# Patient Record
Sex: Male | Born: 1960 | Race: Black or African American | Hispanic: No | Marital: Single | Smoking: Never smoker
Health system: Southern US, Community
[De-identification: ages and names within clinical notes are randomized; demographics above are authoritative.]

## PROBLEM LIST (undated history)

## (undated) DIAGNOSIS — K449 Diaphragmatic hernia without obstruction or gangrene: Secondary | ICD-10-CM

## (undated) DIAGNOSIS — E785 Hyperlipidemia, unspecified: Secondary | ICD-10-CM

## (undated) DIAGNOSIS — T7840XA Allergy, unspecified, initial encounter: Secondary | ICD-10-CM

## (undated) DIAGNOSIS — K219 Gastro-esophageal reflux disease without esophagitis: Secondary | ICD-10-CM

## (undated) DIAGNOSIS — I493 Ventricular premature depolarization: Secondary | ICD-10-CM

## (undated) DIAGNOSIS — I10 Essential (primary) hypertension: Secondary | ICD-10-CM

## (undated) HISTORY — DX: Diaphragmatic hernia without obstruction or gangrene: K44.9

## (undated) HISTORY — PX: UPPER GASTROINTESTINAL ENDOSCOPY: SHX188

## (undated) HISTORY — DX: Allergy, unspecified, initial encounter: T78.40XA

## (undated) HISTORY — DX: Gastro-esophageal reflux disease without esophagitis: K21.9

## (undated) HISTORY — DX: Hyperlipidemia, unspecified: E78.5

## (undated) HISTORY — PX: COLONOSCOPY: SHX174

## (undated) HISTORY — DX: Ventricular premature depolarization: I49.3

---

## 2006-06-02 ENCOUNTER — Emergency Department (HOSPITAL_COMMUNITY): Admission: EM | Admit: 2006-06-02 | Discharge: 2006-06-02 | Payer: Self-pay | Admitting: Emergency Medicine

## 2009-03-24 ENCOUNTER — Emergency Department (HOSPITAL_COMMUNITY): Admission: EM | Admit: 2009-03-24 | Discharge: 2009-03-24 | Payer: Self-pay | Admitting: Family Medicine

## 2009-05-22 ENCOUNTER — Ambulatory Visit: Payer: Self-pay | Admitting: Family Medicine

## 2009-05-22 DIAGNOSIS — E663 Overweight: Secondary | ICD-10-CM | POA: Insufficient documentation

## 2009-05-22 DIAGNOSIS — M25569 Pain in unspecified knee: Secondary | ICD-10-CM | POA: Insufficient documentation

## 2009-05-22 DIAGNOSIS — L259 Unspecified contact dermatitis, unspecified cause: Secondary | ICD-10-CM

## 2009-05-22 DIAGNOSIS — I10 Essential (primary) hypertension: Secondary | ICD-10-CM

## 2009-05-22 LAB — CONVERTED CEMR LAB
Alkaline Phosphatase: 103 units/L (ref 39–117)
BUN: 10 mg/dL (ref 6–23)
Creatinine, Ser: 0.91 mg/dL (ref 0.40–1.50)
Direct LDL: 144 mg/dL — ABNORMAL HIGH
Glucose, Bld: 84 mg/dL (ref 70–99)
Total Bilirubin: 0.4 mg/dL (ref 0.3–1.2)

## 2009-05-27 ENCOUNTER — Encounter: Payer: Self-pay | Admitting: Family Medicine

## 2009-06-19 ENCOUNTER — Ambulatory Visit: Payer: Self-pay | Admitting: Family Medicine

## 2009-07-17 ENCOUNTER — Ambulatory Visit: Payer: Self-pay | Admitting: Family Medicine

## 2009-08-01 ENCOUNTER — Encounter: Payer: Self-pay | Admitting: Family Medicine

## 2009-09-10 ENCOUNTER — Telehealth (INDEPENDENT_AMBULATORY_CARE_PROVIDER_SITE_OTHER): Payer: Self-pay | Admitting: *Deleted

## 2009-09-16 ENCOUNTER — Ambulatory Visit: Payer: Self-pay | Admitting: Family Medicine

## 2009-09-16 ENCOUNTER — Encounter: Payer: Self-pay | Admitting: *Deleted

## 2009-09-16 ENCOUNTER — Encounter: Payer: Self-pay | Admitting: Family Medicine

## 2009-09-16 LAB — CONVERTED CEMR LAB
Hepatitis B-Post: 0 milliintl units/mL
Rubella: 119.8 intl units/mL — ABNORMAL HIGH
Rubeola IgG: 4.06 — ABNORMAL HIGH

## 2009-09-17 ENCOUNTER — Encounter: Payer: Self-pay | Admitting: Family Medicine

## 2010-09-02 NOTE — Letter (Signed)
Summary: Immunization records [facility] records [facility]   Imported By: De Nurse 10/09/2009 09:39:23  _____________________________________________________________________  External Attachment:    Type:   Image     Comment:   External Document

## 2010-09-02 NOTE — Progress Notes (Signed)
Summary: Lab Work Needed  Phone Note Call from Patient Call back at [phone]   Caller: Patient Summary of Call: Pt needs blood work to see if he has the rubella titer and tetnus shot.  He is starting school and needs this as soon as possible. Initial call taken by: Clydell Hakim,  September 10, 2009 1:39 PM  Follow-up for Phone Call        left message to return call Follow-up by: Gladstone Pih,  September 10, 2009 2:05 PM  Additional Follow-up for Phone Call Additional follow up Details #1::        spoke with pt he needs titer drawn for Rubella,rubeola,Mumps and Varicella and Td immunization.  Explained that if titer low he would need to repeat the immunizations, but wants to do titer verses getting Immunization.   Will you put the orders in or do you want him to sched an appt with you. Additional Follow-up by: Gladstone Pih,  September 11, 2009 2:26 PM  New Problems: PREVENTIVE HEALTH CARE (ICD-V70.0)   Additional Follow-up for Phone Call Additional follow up Details #2::    orders in  Additional Follow-up for Phone Call Additional follow up Details #3:: Details for Additional Follow-up Action Taken: Called pt, left message to return call. Pt returned call explained orders were in the system and that he could call and make a lab apt, to bring form from school with him to ensure correct titers or immunizations are admin per Dr Jennette Kettle Additional Follow-up by: Gladstone Pih,  September 12, 2009 9:09 AM  New Problems: PREVENTIVE HEALTH CARE (ICD-V70.0)

## 2010-09-02 NOTE — Assessment & Plan Note (Signed)
Summary: Tdap given  Nurse Visit   Vital Signs:  Patient profile:   50 year old male Temp:     98.3 degrees F  Vitals Entered By: Theresia Lo RN (September 16, 2009 3:42 PM)  Immunizations Administered:  Tetanus Vaccine:    Vaccine Type: Tdap    Site: left deltoid    Mfr: GlaxoSmithKline    Dose: 0.5 ml    Route: IM    Given by: Theresia Lo RN    Exp. Date: 09/28/2011    Lot #: [id number]    VIS given: 06/21/07 version given September 16, 2009.  Orders Added: 1)  Tdap => 85yrs IM [90715] 2)  Admin 1st Vaccine [90471]     Vital Signs:  Patient profile:   50 year old male Temp:     98.3 degrees F  Vitals Entered By: Theresia Lo RN (September 16, 2009 3:42 PM)

## 2010-09-02 NOTE — Consult Note (Signed)
Summary: [facility]   Imported By: De Nurse 08/13/2009 14:29:37  _____________________________________________________________________  External Attachment:    Type:   Image     Comment:   External Document

## 2010-09-11 ENCOUNTER — Encounter: Payer: Self-pay | Admitting: *Deleted

## 2010-11-08 LAB — POCT RAPID STREP A (OFFICE): Streptococcus, Group A Screen (Direct): POSITIVE — AB

## 2011-07-15 ENCOUNTER — Other Ambulatory Visit: Payer: Self-pay

## 2011-07-15 ENCOUNTER — Emergency Department (HOSPITAL_COMMUNITY)
Admission: EM | Admit: 2011-07-15 | Discharge: 2011-07-15 | Disposition: A | Discharge status: Home / Self Care | Payer: BC Managed Care – PPO | Source: Home / Self Care | Attending: Emergency Medicine | Admitting: Emergency Medicine

## 2011-07-15 DIAGNOSIS — R42 Dizziness and giddiness: Secondary | ICD-10-CM

## 2011-07-15 HISTORY — DX: Essential (primary) hypertension: I10

## 2011-07-15 LAB — POCT I-STAT, CHEM 8
Calcium, Ion: 1.21 mmol/L (ref 1.12–1.32)
Creatinine, Ser: 1 mg/dL (ref 0.50–1.35)
Glucose, Bld: 90 mg/dL (ref 70–99)
HCT: 50 % (ref 39.0–52.0)
Hemoglobin: 17 g/dL (ref 13.0–17.0)

## 2011-07-15 NOTE — ED Provider Notes (Signed)
History     CSN: [id number] Arrival date & time: 07/15/2011  3:43 PM   First MD Initiated Contact with Patient 07/15/11 1559      Chief Complaint  Patient presents with  . Dizziness    (Consider location/radiation/quality/duration/timing/severity/associated sxs/prior treatment) HPI Comments: Brett Shaw had an episode of dizziness today at work that began fairly suddenly at around 12 noon and lasted for an hour. It has gone away completely right now. He felt lightheaded and off balance. He denied any spinning of the surroundings or his head or at sensation of presyncope. He denies any other associated symptoms such as headaches, diplopia, blurred vision, shortness of breath, chest pain, tightness, pressure, palpitations, paresthesias, muscle weakness, or difficulty speaking. He has not had episodes like this in the past. He denies any history of cardiac disease, stroke, or diabetes. In the chart there is a history of hypertension and the fact that he was on quinapril, but he himself denies any history of ever having had high blood pressure or ever having taken quinapril. He is not taking any medication right now. He denies any abdominal pain or tarry stools. He has had no chest pain or shortness of breath. His symptoms were not worsened by movement or posture.   Past Medical History  Diagnosis Date  . Hypertension     History reviewed. No pertinent past surgical history.  Family History  Problem Relation Age of Onset  . Diabetes Other   . Heart failure Other     History  Substance Use Topics  . Smoking status: Never Smoker   . Smokeless tobacco: Not on file  . Alcohol Use: Yes      Review of Systems  HENT: Negative for hearing loss, ear pain, congestion, rhinorrhea, sneezing, postnasal drip and tinnitus.   Eyes: Negative for visual disturbance.  Cardiovascular: Negative for chest pain and palpitations.  Neurological: Positive for dizziness and light-headedness. Negative for  syncope, facial asymmetry, speech difficulty, weakness, numbness and headaches.    Allergies  Shellfish allergy  Home Medications  No current outpatient prescriptions on file.  BP 128/77  Pulse 78  Temp(Src) 97.1 F (36.2 C) (Oral)  Resp 18  SpO2 97%  Physical Exam  Nursing note and vitals reviewed. Constitutional: He is oriented to person, place, and time. He appears well-developed and well-nourished. No distress.  HENT:  Head: Normocephalic and atraumatic.  Right Ear: External ear normal.  Left Ear: External ear normal.  Nose: Nose normal.  Mouth/Throat: Oropharynx is clear and moist.  Eyes: Conjunctivae and EOM are normal. Pupils are equal, round, and reactive to light. Right eye exhibits normal extraocular motion and no nystagmus. Left eye exhibits normal extraocular motion and no nystagmus.  Neck: Normal range of motion. Neck supple.  Cardiovascular: Normal rate, regular rhythm, normal heart sounds and intact distal pulses.  Exam reveals no gallop and no friction rub.   No murmur heard. Neurological: He is alert and oriented to person, place, and time. He has normal strength and normal reflexes. He displays no tremor. No cranial nerve deficit or sensory deficit. He exhibits normal muscle tone. Coordination and gait normal.       On neuro exam he was alert and oriented x3. His speech was clear, fluent, and appropriate. PERRLA, full EOMs, fundi were benign. His gait was normal, he was able to perform tandem gait and Romberg sign was negative. Muscle strength was normal in upper and lower extremities. DTRs were 1+ and symmetrical. Sensation was intact to  light touch and cranial nerves were intact. His Dix-Hallpike maneuver was negative both the right and the left.  Skin: He is not diaphoretic.    ED Course  Procedures (including critical care time)  Results for orders placed during the hospital encounter of 07/15/11  GLUCOSE, CAPILLARY      Component Value Range    Glucose-Capillary 82  70 - 99 (mg/dL)  POCT I-STAT, CHEM 8      Component Value Range   Sodium 140  135 - 145 (mEq/L)   Potassium 3.9  3.5 - 5.1 (mEq/L)   Chloride 106  96 - 112 (mEq/L)   BUN 11  6 - 23 (mg/dL)   Creatinine, Ser 4.09  0.50 - 1.35 (mg/dL)   Glucose, Bld 90  70 - 99 (mg/dL)   Calcium, Ion 8.11  9.14 - 1.32 (mmol/L)   TCO2 27  0 - 100 (mmol/L)   Hemoglobin 17.0  13.0 - 17.0 (g/dL)   HCT 78.2  95.6 - 21.3 (%)      Labs Reviewed  GLUCOSE, CAPILLARY  POCT I-STAT, CHEM 8  I-STAT, CHEM 8   No results found.   Date: 07/15/2011  Rate: 72  Rhythm: normal sinus rhythm  QRS Axis: normal  Intervals: normal  ST/T Wave abnormalities: nonspecific T wave changes  Conduction Disutrbances:none  Narrative Interpretation: Normal sinus rhythm with sinus arrhythmia, nonspecific T wave changes, otherwise normal  Old EKG Reviewed: none available   1. Dizziness       MDM  The patient had a one-hour episode of dizziness today which was not vertigo and not presyncope without any other obvious red flag type symptoms. His may have represented a short-lived episode of benign positional vertigo or acute labyrinthitis which resolved quickly on its own. There is no sign of stroke like symptoms. His EKG was normal except for some nonspecific T wave changes no old EKG for comparison. I told him for right now to take tomorrow off and for any further episodes to return either here or to the emergency room and gave him some red flag symptoms to watch out for in the ensuing days. I think his nonspecific T wave changes need to be followed up by a cardiologist and gave him the name of Dr. Marca Ancona for followup.        Roque Lias, MD 07/15/11 [phone]

## 2011-07-15 NOTE — ED Notes (Signed)
C/o has been nauseated in AM for past few weeks, and this AM while at work, had episode of weakness and dizziness , denies sweating or nausea; dizziness not reproducible through position changes

## 2011-07-16 ENCOUNTER — Telehealth (HOSPITAL_COMMUNITY): Payer: Self-pay | Admitting: Emergency Medicine

## 2011-07-16 NOTE — ED Notes (Signed)
[facility] and was found to have non-specific T wave changes on his EKG.  Will make appointment with Dr. Marca Ancona.  Roque Lias, MD 07/16/11 1016

## 2011-07-16 NOTE — Telephone Encounter (Signed)
Patient will be scheduled with Dr. Shirlee Latch.

## 2011-07-16 NOTE — Telephone Encounter (Signed)
Appointment to be made for patient with Dr. Marca Ancona.

## 2011-07-20 ENCOUNTER — Telehealth (HOSPITAL_COMMUNITY): Payer: Self-pay | Admitting: *Deleted

## 2011-07-20 ENCOUNTER — Telehealth: Payer: Self-pay | Admitting: Cardiology

## 2011-07-20 NOTE — ED Notes (Signed)
I called Dr. Alford Highland office and they said they can see where Dr. Lorenz Coaster did a referral, but it is not where they would see it to know to schedule an appt. I explained that my system is not set up to this yet.  Appt. scheduled for Jan 11 @ 1430, pt. to arrive @ 1415 to do paperwork.  Pt. notified of appt. Pt. told to go to ED if any further episodes prior to his appt. as instructed.  Dr. Lorenz Coaster notified that appt has been made adn patient is aware. Brett Shaw 07/20/2011

## 2011-08-14 ENCOUNTER — Encounter: Payer: Self-pay | Admitting: *Deleted

## 2011-08-14 ENCOUNTER — Ambulatory Visit (INDEPENDENT_AMBULATORY_CARE_PROVIDER_SITE_OTHER): Payer: BC Managed Care – PPO | Admitting: Cardiology

## 2011-08-14 ENCOUNTER — Encounter: Payer: Self-pay | Admitting: Cardiology

## 2011-08-14 VITALS — BP 140/96 | HR 84 | Ht 71.0 in | Wt 296.0 lb

## 2011-08-14 DIAGNOSIS — Z9189 Other specified personal risk factors, not elsewhere classified: Secondary | ICD-10-CM

## 2011-08-14 DIAGNOSIS — R42 Dizziness and giddiness: Secondary | ICD-10-CM

## 2011-08-14 NOTE — Patient Instructions (Signed)
Take aspirin 81mg  daily.  Your physician recommends that you return for a FASTING lipid profile: 780.4  Your physician has recommended that you wear a holter monitor. Holter monitors are medical devices that record the heart's electrical activity. Doctors most often use these monitors to diagnose arrhythmias. Arrhythmias are problems with the speed or rhythm of the heartbeat. The monitor is a small, portable device. You can wear one while you do your normal daily activities. This is usually used to diagnose what is causing palpitations/syncope (passing out). 24 hour  You do not need to schedule a follow-up appointment with Dr Shirlee Latch.

## 2011-08-16 DIAGNOSIS — R42 Dizziness and giddiness: Secondary | ICD-10-CM | POA: Insufficient documentation

## 2011-08-16 DIAGNOSIS — Z9189 Other specified personal risk factors, not elsewhere classified: Secondary | ICD-10-CM | POA: Insufficient documentation

## 2011-08-16 NOTE — Assessment & Plan Note (Signed)
Patient's cardiac risk factors include premature CAD and HTN.  BP is mildly elevated today.  I am going to check his lipids and have him start ASA 81 mg daily.  He may need antihypertensive treatment.  He certainly needs dieting/exercise for weight loss.

## 2011-08-16 NOTE — Progress Notes (Signed)
PCP: Dr. Jennette Kettle  51 yo with history of HTN presents for cardiology evaluation after visit to ER in 12/12 for lightheadedness.  Patient had been at work and standing for about 4 hours (normal for him at work).  He became lightheaded.  This persisted for several hours, so he went to the ER.  No syncope, no palpitations, no chest pain.  No dyspnea.  ECG and orthostatics in the ER were unremarkable.  He was discharged to followup with cardiology.  He has had no recurrence of these symptoms.  He has a history of HTN but is on no medications.  BP is mildly elevated today.  His brother had an MI at age 5.    ECG: NSR, nonspecific lateral T wave inversions.    Labs (12/12): creatinine 1.0, HCT 50  PMH: 1. HTN 2. GERD 3. Obesity  FH: Brother with MI at 56, father with MI at 36  SH: Nonsmoker, moved to [place] not long ago from [place].  Works in [employer].  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   ROS: All systems reviewed and negative except as per HPI.   Current Outpatient Prescriptions  Medication Sig Dispense Refill  . cholecalciferol (VITAMIN D) 1000 UNITS tablet Take 1,000 Units by mouth daily.      . Cyanocobalamin (B-12) 1000 MCG CAPS Take by mouth daily.      . Multiple Vitamin (MULITIVITAMIN WITH MINERALS) TABS Take 1 tablet by mouth daily.      Marland Kitchen aspirin EC 81 MG tablet Take 1 tablet (81 mg total) by mouth daily.        BP 140/96  Pulse 84  Ht 5\' 11"  (1.803 m)  Wt 134.265 kg (296 lb)  BMI 41.28 kg/m2 General: NAD, obese Neck: No JVD, no thyromegaly or thyroid nodule.  Lungs: Clear to auscultation bilaterally with normal respiratory effort. CV: Nondisplaced PMI.  Heart regular S1/S2, no S3/S4, no murmur.  No peripheral edema.  No carotid bruit.  Normal pedal pulses.  Abdomen: Soft, nontender, no hepatosplenomegaly, no distention.  Skin: Intact without lesions or rashes.  Neurologic: Alert and oriented x 3.  Psych: Normal affect. Extremities: No  clubbing or cyanosis.  HEENT: Normal.

## 2011-08-16 NOTE — Assessment & Plan Note (Signed)
Patient had an episode of prolonged dizziness after standing for about 4 hours at work.  This is not an unusual amount of standing for him.  He was not orthostatic.  He has not had any further symptoms.  It is possible that he was dehydrated.  I will have him wear a 48 hour holter monitor.

## 2011-08-21 ENCOUNTER — Other Ambulatory Visit: Payer: BC Managed Care – PPO | Admitting: *Deleted

## 2011-08-21 ENCOUNTER — Encounter (INDEPENDENT_AMBULATORY_CARE_PROVIDER_SITE_OTHER): Payer: BC Managed Care – PPO

## 2011-08-21 DIAGNOSIS — R42 Dizziness and giddiness: Secondary | ICD-10-CM

## 2011-08-24 ENCOUNTER — Telehealth: Payer: Self-pay | Admitting: Cardiology

## 2011-08-24 ENCOUNTER — Other Ambulatory Visit (INDEPENDENT_AMBULATORY_CARE_PROVIDER_SITE_OTHER): Payer: BC Managed Care – PPO | Admitting: *Deleted

## 2011-08-24 DIAGNOSIS — R42 Dizziness and giddiness: Secondary | ICD-10-CM

## 2011-08-24 LAB — LIPID PANEL: VLDL: 29.2 mg/dL (ref 0.0–40.0)

## 2011-08-24 LAB — LDL CHOLESTEROL, DIRECT: Direct LDL: 147.5 mg/dL

## 2011-08-24 NOTE — Telephone Encounter (Signed)
Walk in pt Form " Pt Would Like a Call Please " sent to Message Nurse  08/24/11/KM

## 2011-08-25 ENCOUNTER — Telehealth: Payer: Self-pay | Admitting: *Deleted

## 2011-08-25 DIAGNOSIS — R42 Dizziness and giddiness: Secondary | ICD-10-CM

## 2011-08-25 MED ORDER — PRAVASTATIN SODIUM 40 MG PO TABS: 40.0000 mg | ORAL_TABLET | Freq: Every evening | ORAL | Status: DC

## 2011-08-25 NOTE — Telephone Encounter (Signed)
Notes Recorded by Jacqlyn Krauss, RN on 08/25/2011 at 5:14 PM Discussed with pt. Pt agreed to begin pravastatin 40mg  daily in the evening. He will return for fasting lipid/liver profile 10/20/11. ------  Notes Recorded by Jacqlyn Krauss, RN on 08/25/2011 at 2:10 PM Pt not at home. ------  Notes Recorded by Marca Ancona, MD on 08/25/2011 at 10:38 AM Brother had MI at 40. I would like to see LDL lower. Would suggest starting pravstatin 40 mg daily with lipids/LFTs in 2 months.

## 2011-08-27 ENCOUNTER — Encounter: Payer: Self-pay | Admitting: Family Medicine

## 2011-08-27 ENCOUNTER — Ambulatory Visit (INDEPENDENT_AMBULATORY_CARE_PROVIDER_SITE_OTHER): Payer: BC Managed Care – PPO | Admitting: Family Medicine

## 2011-08-27 VITALS — BP 134/84 | HR 90 | Temp 98.1°F | Ht 71.0 in | Wt 294.2 lb

## 2011-08-27 DIAGNOSIS — Z9189 Other specified personal risk factors, not elsewhere classified: Secondary | ICD-10-CM

## 2011-08-27 DIAGNOSIS — K219 Gastro-esophageal reflux disease without esophagitis: Secondary | ICD-10-CM

## 2011-08-27 DIAGNOSIS — E785 Hyperlipidemia, unspecified: Secondary | ICD-10-CM

## 2011-08-27 DIAGNOSIS — E663 Overweight: Secondary | ICD-10-CM

## 2011-08-27 MED ORDER — ESOMEPRAZOLE MAGNESIUM 40 MG PO CPDR: DELAYED_RELEASE_CAPSULE | ORAL | Status: DC

## 2011-08-27 NOTE — Progress Notes (Signed)
  Subjective:    Patient ID: Brett Shaw, male    DOB: [birth year], 51 y.o.   MRN: [id number]  HPI  GERD: The last 6 weeks he stated significant increase in his reflux symptoms. Has previously had reflux several years ago but was not bothered by again  until recently. He has gained quite a bit of weight. He is working 12 hour shifts with a lot of overtime, not resting much. Eating a poor diet by his report.  Dizziness: Was seen recently in urgent care for dizziness. Had followup at cardiology where he evidently wore a Holter monitor for 2 days. He has not yet gotten those results back. His cardiologist started him on pravastatin which is not yet picked up at the drug store. He has not had any recurrence of the dizziness. He denies chest pains with exertion. He denies any change in his exercise tolerance. He noted no lower extremity swelling.  Review of Systems    see hpi  Objective:   Physical Exam  Vital signs reviewed. GENERAL: Well-developed, well-nourished, no acute distress. OverweightCARDIOVASCULAR: Regular rate and rhythm no murmur gallop or rub LUNGS: Clear to auscultation bilaterally, no rales or wheeze. ABDOMEN: Soft positive bowel sounds NEURO: No gross focal neurological deficits. MSK: Movement of extremity x 4.       Assessment & Plan:  1. GERD would like to start him on high dose therapy with Nexium twice a day. Unclear if his insurance will cover that. If not we will start with 40 mg daily GERD has a followup appointment with me in 3 weeks. #2. Previously diagnosed with hypertension and has had some elevated blood pressures. Right now he is not on any antihypertensive medicines. He is open to making some lifestyle modifications. #3. Hyperlipidemia. Positive family history as a risk factor for cardiovascular disease. Dr. Jearld Pies has start him on pravastatin. I urged him to pick that up and start medication.

## 2011-08-31 ENCOUNTER — Telehealth: Payer: Self-pay | Admitting: *Deleted

## 2011-08-31 NOTE — Telephone Encounter (Signed)
Dr Shirlee Latch reviewed monitor done 08/21/11--frequent PACs and PVCs. If palpitations can try B blocker. Otherwise no treatment necessary. I discussed with pt. Pt states he is not having any palpitations and did not want medication at this time.

## 2011-09-16 ENCOUNTER — Encounter: Payer: Self-pay | Admitting: Family Medicine

## 2011-09-16 ENCOUNTER — Ambulatory Visit (INDEPENDENT_AMBULATORY_CARE_PROVIDER_SITE_OTHER): Payer: BC Managed Care – PPO | Admitting: Family Medicine

## 2011-09-16 VITALS — BP 112/84 | HR 72 | Temp 97.7°F | Ht 71.0 in | Wt 287.0 lb

## 2011-09-16 DIAGNOSIS — Z9189 Other specified personal risk factors, not elsewhere classified: Secondary | ICD-10-CM

## 2011-09-16 DIAGNOSIS — I10 Essential (primary) hypertension: Secondary | ICD-10-CM

## 2011-09-16 DIAGNOSIS — E785 Hyperlipidemia, unspecified: Secondary | ICD-10-CM

## 2011-09-16 DIAGNOSIS — J309 Allergic rhinitis, unspecified: Secondary | ICD-10-CM | POA: Insufficient documentation

## 2011-09-16 DIAGNOSIS — Z0000 Encounter for general adult medical examination without abnormal findings: Secondary | ICD-10-CM | POA: Insufficient documentation

## 2011-09-16 DIAGNOSIS — K219 Gastro-esophageal reflux disease without esophagitis: Secondary | ICD-10-CM

## 2011-09-16 LAB — LIPID PANEL
Cholesterol: 208 mg/dL — ABNORMAL HIGH (ref 0–200)
Total CHOL/HDL Ratio: 5.3 Ratio
Triglycerides: 152 mg/dL — ABNORMAL HIGH (ref <150)
VLDL: 30 mg/dL (ref 0–40)

## 2011-09-16 LAB — COMPREHENSIVE METABOLIC PANEL
AST: 20 U/L (ref 0–37)
Albumin: 4.1 g/dL (ref 3.5–5.2)
BUN: 10 mg/dL (ref 6–23)
Calcium: 9.2 mg/dL (ref 8.4–10.5)
Chloride: 105 mEq/L (ref 96–112)
Creat: 1 mg/dL (ref 0.50–1.35)
Glucose, Bld: 83 mg/dL (ref 70–99)
Potassium: 4.3 mEq/L (ref 3.5–5.3)

## 2011-09-16 MED ORDER — FLUTICASONE PROPIONATE 50 MCG/ACT NA SUSP: 2.0000 | Freq: Every day | NASAL | Status: DC

## 2011-09-16 NOTE — Assessment & Plan Note (Signed)
#  2. Elevated blood pressure with previous diagnosis of hypertension. His systolic is actually much better today and he has made significant Lifestyle modifications. He will continue with diet and exercise plan and I'll see him back in 3-6 months for followup.

## 2011-09-16 NOTE — Assessment & Plan Note (Signed)
2. History of hyperlipidemia. Fasting lipid profile today. We did discuss and if he has to start a medication he would like to start the "safest" one. His cardiologist had given him a prescription for Pravachol but he has not yet filled that.

## 2011-09-16 NOTE — Assessment & Plan Note (Signed)
GERD significantly improved on twice a day dosing of Nexium. We will decrease to daily and follow. If breakthru, will return to BID for 3 m.

## 2011-09-16 NOTE — Progress Notes (Signed)
  Subjective:    Patient ID: Brett Shaw, male    DOB: Jun 10, 1961, 51 y.o.   MRN: [id number]  HPI #1. Here for preventive care and followup of GERD. Significant improvement on a twice a day Nexium. He has actually skipped a few days here and there and not had breakthrough. His insurance covered the twice a day dosing but it was with a very high co-pay. He would like to either decrease or discontinue it. #2. Obesity and hypertension but she is trying to manage currently with lifestyle modifications. He has made significant change in his diet and has lost 9 pounds. He would like to continue lifestyle modification for blood pressure treatment at this time. #3. Previously diagnosed with hyperlipidemia. His cardiologist had given him a prescription for Pravachol however he has not yet filled that. He will see a fasting lipid profile today and then discuss further. #4. Palpitations. His cardiologist did a Holter monitor and it showed PVCs and PACs. He has chosen not to pursue any type of beta blocker for symptom relief as he says the symptoms are not that bad. #5. Chronic nasal stuffiness. Says he has had his nasal sinuses looked at multiple times with scopes et Karie Soda. Has previously had broken nose. They told him he might benefit from having nasal or sinus surgery but he has not pursued that. He mostly has a lot of nasal stuffiness and when he gets a cold it is  truly miserable.   Review of Systems    improved reflux. Denies chest pain, denies swelling of extremities. Denies headache, fever, sweats, chills. Objective:   Physical Exam  Vital signs reviewed GENERALl: Well developed, well nourished, in no acute distress.  THYROID: normal without nodularity CAROTID ARTERIES: without bruits LUNGS: clear to auscultation bilaterally. No wheezes or rales. HEART: Regular rate and rhythm, no murmurs ABDOMEN: soft with positive bowel sounds NEURO: No gross focal deficits SKIN: Thorax , upper extremity  and head exam of skin reveals no worrisome lesions. Areas of previous atopic change on the elbow is still have some thickened hyperpigmented skin but significantly improved from the first time I saw him. HEENT: Nasal mucosa boggy extremely swollen turbinates. It looks like he has a septal deviation (from her prior broken nose) oropharynx reveals some cobblestoning in the posterior pharynx but no erythema and no exudate. The neck is without lymphadenopathy. Full range of motion. No lymphadenopathy        Assessment & Plan:  #1. Well adult checkup. #2. Elevated blood pressure with previous diagnosis of hypertension. His systolic is actually much better today and he has made significant Lifestyle modifications. He will continue with diet and exercise plan and I'll see him back in 3-6 months for followup. #3. History of hyperlipidemia. Fasting lipid profile today. We did discuss and if he has to start a medication he would like to start the "safest" one. His cardiologist had given him a prescription for Pravachol but he has not yet filled that. #4. GERD significantly improved on twice a day dosing of Nexium. We will decrease to daily and follow. If breakthru, will return to BID for 3 m. #5. Preventive needs: We discussed diet and exercise. He has declined flu shot. Tdap 09/2009. No family history significant that would require early screening for colonoscopy or prostate cancer. #6. Sinus disease. There is some complaint of allergic rhinitis as he is extremely boggy mucosa. We will try improving his symptoms by using daily fluticasone.

## 2011-09-16 NOTE — Patient Instructions (Signed)
I have called in the nasal spray. We'll see how that does for you. I will send you noted that your labwork. Great to see you. Congratulations on the lifestyle modifications unit made, and they really seem to be working!

## 2011-09-22 ENCOUNTER — Encounter: Payer: Self-pay | Admitting: Family Medicine

## 2011-10-01 ENCOUNTER — Other Ambulatory Visit: Payer: Self-pay | Admitting: *Deleted

## 2011-10-01 MED ORDER — OMEPRAZOLE 20 MG PO TBEC: 40.0000 mg | DELAYED_RELEASE_TABLET | Freq: Two times a day (BID) | ORAL | Status: DC

## 2011-10-20 ENCOUNTER — Other Ambulatory Visit: Payer: BC Managed Care – PPO

## 2011-10-27 ENCOUNTER — Encounter: Payer: Self-pay | Admitting: *Deleted

## 2011-11-15 ENCOUNTER — Emergency Department (HOSPITAL_COMMUNITY): Payer: BC Managed Care – PPO

## 2011-11-15 ENCOUNTER — Other Ambulatory Visit: Payer: Self-pay

## 2011-11-15 ENCOUNTER — Emergency Department (HOSPITAL_COMMUNITY)
Admission: EM | Admit: 2011-11-15 | Discharge: 2011-11-15 | Disposition: A | Discharge status: Home / Self Care | Payer: BC Managed Care – PPO | Attending: Emergency Medicine | Admitting: Emergency Medicine

## 2011-11-15 ENCOUNTER — Encounter (HOSPITAL_COMMUNITY): Payer: Self-pay | Admitting: *Deleted

## 2011-11-15 DIAGNOSIS — R0609 Other forms of dyspnea: Secondary | ICD-10-CM | POA: Insufficient documentation

## 2011-11-15 DIAGNOSIS — E785 Hyperlipidemia, unspecified: Secondary | ICD-10-CM | POA: Insufficient documentation

## 2011-11-15 DIAGNOSIS — R0989 Other specified symptoms and signs involving the circulatory and respiratory systems: Secondary | ICD-10-CM | POA: Insufficient documentation

## 2011-11-15 DIAGNOSIS — K219 Gastro-esophageal reflux disease without esophagitis: Secondary | ICD-10-CM | POA: Insufficient documentation

## 2011-11-15 DIAGNOSIS — R0600 Dyspnea, unspecified: Secondary | ICD-10-CM

## 2011-11-15 DIAGNOSIS — I10 Essential (primary) hypertension: Secondary | ICD-10-CM | POA: Insufficient documentation

## 2011-11-15 DIAGNOSIS — R0602 Shortness of breath: Secondary | ICD-10-CM | POA: Insufficient documentation

## 2011-11-15 DIAGNOSIS — R079 Chest pain, unspecified: Secondary | ICD-10-CM | POA: Insufficient documentation

## 2011-11-15 DIAGNOSIS — Z79899 Other long term (current) drug therapy: Secondary | ICD-10-CM | POA: Insufficient documentation

## 2011-11-15 DIAGNOSIS — R0789 Other chest pain: Secondary | ICD-10-CM | POA: Insufficient documentation

## 2011-11-15 DIAGNOSIS — R42 Dizziness and giddiness: Secondary | ICD-10-CM | POA: Insufficient documentation

## 2011-11-15 NOTE — ED Notes (Signed)
Pt states his breathing is better since his arrival. Pt appears more relaxed.

## 2011-11-15 NOTE — ED Provider Notes (Signed)
History     CSN: [id number]  Arrival date & time 11/15/11  2023   First MD Initiated Contact with Patient 11/15/11 2215      Chief Complaint  Patient presents with  . Chest Pain  . Shortness of Breath  . Dizziness    (Consider location/radiation/quality/duration/timing/severity/associated sxs/prior treatment) HPI Comments: Brett Shaw is a 51 y.o. male who presents with sensation of shortness of breath. For 2 days he feels like he has to think to breath. When he breathes he has to take deep breaths. He is also had some chest tightness, which feels like his reflux disease. He is continuing to take Nexium twice a day. He was evaluated by a cardiologist several months ago, and had a Holter monitor. The monitor showed frequent PVCs. The patient feels his PVCs, but they do not make him have pain. He does not have near syncope, or syncope. He has had no cough, fever, chills, nausea, vomiting. Ambulates easily, and can exercise by walking 3 times a week without problems. Has not missed any work and is able to perform his job.  The history is provided by the patient.    Past Medical History  Diagnosis Date  . Hypertension   . GERD (gastroesophageal reflux disease)   . Hyperlipidemia     History reviewed. No pertinent past surgical history.  Family History  Problem Relation Age of Onset  . Diabetes Other   . Heart failure Other   . Heart attack Brother 2    History  Substance Use Topics  . Smoking status: Never Smoker   . Smokeless tobacco: Not on file  . Alcohol Use: Yes      Review of Systems  All other systems reviewed and are negative.    Allergies  Shellfish allergy  Home Medications   Current Outpatient Rx  Name Route Sig Dispense Refill  . ASPIRIN EC 81 MG PO TBEC Oral Take 1 tablet (81 mg total) by mouth daily.    Marland Kitchen VITAMIN D 1000 UNITS PO TABS Oral Take 1,000 Units by mouth daily.    . B-12 1000 MCG PO CAPS Oral Take by mouth daily.    Marland Kitchen ESOMEPRAZOLE  MAGNESIUM 40 MG PO CPDR  Take one capsule by mouth twice a day 60 capsule 1  . ADULT MULTIVITAMIN W/MINERALS CH Oral Take 1 tablet by mouth daily.    [name] 1 MG DT MISC      . FLUTICASONE PROPIONATE 50 MCG/ACT NA SUSP        BP 136/80  Pulse 82  Temp(Src) 98.3 F (36.8 C) (Oral)  Resp 20  Ht 5\' 11"  (1.803 m)  Wt 285 lb (129.275 kg)  BMI 39.75 kg/m2  SpO2 100%  Physical Exam  Nursing note and vitals reviewed. Constitutional: He is oriented to person, place, and time. He appears well-developed and well-nourished.  HENT:  Head: Normocephalic and atraumatic.  Right Ear: External ear normal.  Left Ear: External ear normal.  Eyes: Conjunctivae and EOM are normal. Pupils are equal, round, and reactive to light.  Neck: Normal range of motion and phonation normal. Neck supple.  Cardiovascular: Normal rate, regular rhythm, normal heart sounds and intact distal pulses.   Pulmonary/Chest: Effort normal and breath sounds normal. He exhibits no bony tenderness.  Abdominal: Soft. Normal appearance. There is no tenderness.  Musculoskeletal: Normal range of motion.  Neurological: He is alert and oriented to person, place, and time. He has normal strength. No cranial nerve deficit or  sensory deficit. He exhibits normal muscle tone. Coordination normal.  Skin: Skin is warm, dry and intact.  Psychiatric: He has a normal mood and affect. His behavior is normal. Judgment and thought content normal.    ED Course  Procedures (including critical care time)   Date: 11/15/2011  Rate: 69  Rhythm: normal sinus rhythm  QRS Axis: normal  Intervals: normal  ST/T Wave abnormalities: normal  Conduction Disutrbances:none  Narrative Interpretation:   Old EKG Reviewed: changes noted-minor T-wave abnormality, resolved    Labs Reviewed - No data to display No results found. Reeval., 23:19- he states it that he is belching some and has been diagnosed with a hiatal hernia in the past. The.  1.  Dyspnea       MDM  Nonspecific dizziness, doubt ACS, PE, pneumonia, bronchitis, or metabolic instability.   Plan: Home Medications- trial of Maalox for belching; Home Treatments- rest; Recommended follow up- PCP in 1-2 weeks        Flint Melter, MD 11/19/11 [phone]

## 2011-11-15 NOTE — ED Notes (Addendum)
Pt c/o SOB and labored breathing since yesterday afternoon. Pt states he "has to think about breathing". Pt also c/o of chest tightness that occurs when he becomes SOB. Pt states he has been seen multiple times for this problem since December but symptoms continue. Pt states symptoms are at their worst when he is at work or doing strenuous activity.

## 2011-11-15 NOTE — ED Notes (Signed)
Pt reports working in the yard yesterday afternoon and had labored breathing, central chest pain and took nexium. Pt reports shortness of breath and chest pain has continued.  Pt was seen by Cardiologist in January.  Pt reports he has had issues with chest pain intermittently since December 2012.  Pt attributes his symptoms to reflux.

## 2011-11-15 NOTE — Discharge Instructions (Signed)
See your doctor as scheduled, sooner if needed for problems.   Shortness of Breath Shortness of breath (dyspnea) is the feeling of uneasy breathing. Shortness of breath does not always mean that there is a life-threatening illness. However, shortness of breath requires immediate medical care. CAUSES  Causes for shortness of breath include:  Not enough oxygen in the air (as with high altitudes or with a smoke-filled room).   Short-term (acute) lung disease, including:   Infections such as pneumonia.   Fluid in the lungs, such as heart failure.   A blood clot in the lungs (pulmonary embolism).   Lasting (chronic) lung diseases.   Heart disease (heart attack, angina, heart failure, and others).   Low red blood cells (anemia).   Poor physical fitness. This can cause shortness of breath when you exercise.   Chest or back injuries or stiffness.   Being overweight (obese).   Anxiety. This can make you feel like you are not getting enough air.  DIAGNOSIS  Serious medical problems can usually be found during your physical exam. Many tests may also be done to determine why you are having shortness of breath. Tests include:  Chest X-rays.   Lung function tests.   Blood tests.   Electrocardiography.   Exercise testing.   A cardiac echo.   Imaging scans.  Your caregiver may not be able to find a cause for your shortness of breath after your exam. In this case, it is important to have a follow-up exam with your caregiver as directed.  HOME CARE INSTRUCTIONS   Do not smoke. Smoking is a common cause of shortness of breath. Ask for help to stop smoking.   Avoid being around chemicals that may bother your breathing (paint fumes, dust).   Rest as needed. Slowly resume your usual activities.   If medicines were prescribed, take them as directed for the full length of time directed. This includes oxygen and any inhaled medicines.   Follow up with your caregiver as directed.  Waiting to do so or failure to follow up could result in worsening of your condition and possible disability or death.   Be sure you understand what to do or who to call if your shortness of breath worsens.  SEEK MEDICAL CARE IF:   Your condition does not improve in the time expected.   You have a hard time doing your normal activities even with rest.   You have any side effects or problems with the medicines prescribed.   You develop any new symptoms.  SEEK IMMEDIATE MEDICAL CARE IF:   Your shortness of breath is getting worse.   You feel lightheaded, faint, or develop a cough not controlled with medicines.   You start coughing up blood.   You have pain with breathing.   You have chest pain or pain in your arms, shoulders, or abdomen.   You have a fever.   You are unable to walk up stairs or exercise the way you normally do.   Your symptoms are getting worse.  Document Released: 04/14/2001 Document Revised: 07/09/2011 Document Reviewed: 11/30/2007 [facility] Patient Information 2012 [place], [place].

## 2011-11-18 ENCOUNTER — Encounter: Payer: Self-pay | Admitting: Family Medicine

## 2011-11-18 ENCOUNTER — Ambulatory Visit (INDEPENDENT_AMBULATORY_CARE_PROVIDER_SITE_OTHER): Payer: BC Managed Care – PPO | Admitting: Family Medicine

## 2011-11-18 VITALS — BP 122/90 | Temp 98.2°F | Ht 71.0 in | Wt 275.2 lb

## 2011-11-18 DIAGNOSIS — I10 Essential (primary) hypertension: Secondary | ICD-10-CM

## 2011-11-18 DIAGNOSIS — E785 Hyperlipidemia, unspecified: Secondary | ICD-10-CM

## 2011-11-18 DIAGNOSIS — K219 Gastro-esophageal reflux disease without esophagitis: Secondary | ICD-10-CM

## 2011-11-18 LAB — LIPID PANEL
Cholesterol: 206 mg/dL — ABNORMAL HIGH (ref 0–200)
HDL: 38 mg/dL — ABNORMAL LOW (ref >39)
Total CHOL/HDL Ratio: 5.4 Ratio
Triglycerides: 128 mg/dL (ref <150)

## 2011-11-18 NOTE — Patient Instructions (Signed)
Please make an appointment with Dr Jearld Pies. We will send your colesterol results to him. We will also call you with your appointment for

## 2011-11-19 ENCOUNTER — Telehealth: Payer: Self-pay | Admitting: Family Medicine

## 2011-11-19 ENCOUNTER — Encounter: Payer: Self-pay | Admitting: Family Medicine

## 2011-11-19 DIAGNOSIS — K219 Gastro-esophageal reflux disease without esophagitis: Secondary | ICD-10-CM

## 2011-11-19 MED ORDER — OMEPRAZOLE 40 MG PO CPDR: 40.0000 mg | DELAYED_RELEASE_CAPSULE | Freq: Two times a day (BID) | ORAL | Status: DC

## 2011-11-19 NOTE — Telephone Encounter (Signed)
Brett Shaw calling to inquire about the cheaper version of Nexium that was going to be sent to his  pharmacy, [facility] on [address]  Called this morning to see if ready, was told nothing was ever sent in.  Please fill rx and let him him when it's ready.

## 2011-11-19 NOTE — Assessment & Plan Note (Signed)
He is currently working a lot some modifications for decrease in elevated blood pressure. Previous diagnosis of hypertension he has not been on medication now for quite some time. He successfully losing weight. I will see him back in one to 2 months. Hopefully we can continue on a statin keep him off blood pressure medicine. He has had some increased shortness of breath however so I want him to go back and see his cardiologist. He may be beneficial long-term for him just go ahead and start a low-dose ACE inhibitor or other type of medication for renal protective effect as well. Only that to the discretion of the cardiologist.

## 2011-11-19 NOTE — Telephone Encounter (Addendum)
rx is ready for p/u. Called pt and line was busy.Loralee Pacas [place]

## 2011-11-19 NOTE — Assessment & Plan Note (Signed)
Has not been taking his Pravachol. Would like to recheck his cholesterol level as he has made significant lifestyle modifications.

## 2011-11-19 NOTE — Progress Notes (Signed)
  Subjective:    Patient ID: Brett Shaw, male    DOB: 1961-03-17, 51 y.o.   MRN: [id number]  HPI Followup ER visit for increasing shortness of breath. He asked he sounds a little but more consistent with anxiety as he was having sensation of having to remind himself to breathe. He does admit to some increased shortness of breath with going up and down steps but has had no shortness of breath on the job and he has a fairly Administrator, sports job. No chest pains but has had some chest tightness.  #2. He has not taken the Pravachol regularly. In fact he only took for about a week, felt like he was having increased muscle pains and then stopped it. He would like his cholesterol numbers rechecked because he's made significant lifestyle modifications.  #3. Has been using Nexium for reflux. Is having difficulty affording that wonders if he could switch to some other type of medicine. He is also wondering if he needs a repeat EGD as his symptoms are not well controlled currently on the Nexium twice a day. He had felt his last EGD done in [place] 6 or 7 years ago. He doesn't recall the specific results. He may have had a hiatal hernia. His symptoms or heartburn R. daily, several times throughout the day, worse at night and after eating.   Review of Systems Denies fever, sweats, chills. Please see history of present illness above for additional pertinent review of systems. He has had some weight loss but this is intentional.    Objective:   Physical Exam  Vital signs reviewed GENERALl: Well developed, well nourished, in no acute distress. NECK: Supple, FROM, without lymphadenopathy.  THYROID: normal without nodularity CAROTID ARTERIES: without bruits LUNGS: clear to auscultation bilaterally. No wheezes or rales. HEART: Regular rate and rhythm, no murmurs ABDOMEN: soft with positive bowel sounds NEURO: No gross focal deficits        Assessment & Plan:

## 2011-11-19 NOTE — Assessment & Plan Note (Signed)
GERD symptoms not well controlled currently on twice a day Nexium. Having difficulty affording the Nexium so he wants to switch to a generic. As she's not getting adequate control with the Nexium, little hesitant to do that. We discussed that he would like to proceed forward with another EGD. He had one done years ago when he was in [place]. Doesn't recall specifically what it showed.

## 2011-11-19 NOTE — Assessment & Plan Note (Signed)
Will set up for GI eval, possoble EGD.

## 2011-11-20 ENCOUNTER — Encounter: Payer: Self-pay | Admitting: Internal Medicine

## 2011-11-23 ENCOUNTER — Ambulatory Visit (INDEPENDENT_AMBULATORY_CARE_PROVIDER_SITE_OTHER): Payer: BC Managed Care – PPO | Admitting: Nurse Practitioner

## 2011-11-23 ENCOUNTER — Encounter: Payer: Self-pay | Admitting: Nurse Practitioner

## 2011-11-23 VITALS — BP 132/82 | HR 72 | Ht 71.0 in | Wt 277.8 lb

## 2011-11-23 DIAGNOSIS — R072 Precordial pain: Secondary | ICD-10-CM

## 2011-11-23 DIAGNOSIS — R0789 Other chest pain: Secondary | ICD-10-CM | POA: Insufficient documentation

## 2011-11-23 NOTE — Progress Notes (Signed)
Patient Name: Brett Shaw Date of Encounter: 11/23/2011  Primary Care Provider:  Denny Levy, MD, MD Primary Cardiologist:  Golden Circle, MD  Patient Profile   51 year old male with history of PVCs presents for followup secondary to chest discomfort and dyspnea.  Problem List   Past Medical History  Diagnosis Date  . Hypertension   . GERD (gastroesophageal reflux disease)   . Hyperlipidemia   . PVC's (premature ventricular contractions)     a. noted on holter 08/2011  . Hiatal hernia    Allergies  Allergies  Allergen Reactions  . Shellfish Allergy     HPI  51 year old male with the above problem list.  He was last seen by Dr. Jearld Pies earlier this year at which time he underwent a Holter monitor showing frequent PVCs and PACs.  Patient was offered beta blocker but isn't interested for fear of fatigue.  He's done okay since then but that 10 days ago felt mildly short of breath stating that he "had to think to breathe."  By his description, it was not necessarily dyspnea but more an awareness of his breathing and an inability to get at one good satisfying breath.  Though this was not associated with chest pain, has also been noting intermittent tightness going across his chest sideways last a few minutes and resolved spontaneously.  He also has symptoms of indigestion which he can distinguish from this tightness.  Because of his awareness of his breathing over last weekend, he presented to the [facility] long ED where he had no acute findings on workup.  Since that visit, he has had no subsequent awareness of his breathing and was able to go to his bowling league without any chest pain or dyspnea the other night.  Home Medications  Prior to Admission medications   Medication Sig Start Date End Date Taking? Authorizing Provider  aspirin EC 81 MG tablet Take 1 tablet (81 mg total) by mouth daily. 08/14/11  Yes Laurey Morale, MD  cholecalciferol (VITAMIN D) 1000 UNITS tablet Take 1,000  Units by mouth daily.   Yes Historical Provider, MD  Cyanocobalamin (B-12) 1000 MCG CAPS Take by mouth daily.   Yes Historical Provider, MD  Multiple Vitamin (MULITIVITAMIN WITH MINERALS) TABS Take 1 tablet by mouth daily.   Yes Historical Provider, MD  omeprazole (PRILOSEC) 40 MG capsule Take 40 mg by mouth Twice daily. 11/19/11  Yes Historical Provider, MD   Review of Systems Chest pain and ? Dyspnea as outlined above.  He has a h/o gerd and hh with frequent indigestion.  No n, v, dizziness, syncope, edema, early satiety, dysuria, dark stools, blood in stools, diarrhea, rash/skin changes, fevers, chills, wt loss/gain.  Otherwise all systems reviewed and negative.  Physical Exam  Blood pressure 132/82, pulse 72, height 5\' 11"  (1.803 m), weight 277 lb 12.8 oz (126.009 kg).  General: Pleasant, NAD Psych: Normal affect. Neuro: Alert and oriented X 3. Moves all extremities spontaneously. HEENT: Normal  Neck: Supple without bruits or JVD. Lungs:  Resp regular and unlabored, CTA. Heart: RRR no s3, s4, or murmurs. Abdomen: Soft, non-tender, non-distended, BS + x 4.  Extremities: No clubbing, cyanosis or edema. DP/PT/Radials 2+ and equal bilaterally.  Accessory Clinical Findings  ECG reviewed from [facility] ED - sinus rhythm, no acute st/t changes.  Assessment & Plan  1.  Midsternal chest pain:  Pt has a few different types of chest discomfort.  He thinks he can distinguish between his indigestion and the occasional tightness  in the center of his chest.  This does not occur with exertion and is self-limiting.  Will obtain an ETT to r/o ischemia.  2.  SOB:  Pt was more aware of his breathing for a period of about 2 days last week.  By his description, he wasn't necessarily sob, just aware of his breathing and trying to get that 1 good breath.  He was seen in the ED on the second day of Ss w/o acute findings.  CXR was normal.  ? Anxiety.  Obtaining ETT as above given h/o chest pain as well.  No evidence  of CHF.    Nicolasa Ducking, NP 11/23/2011, 5:25 PM

## 2011-11-23 NOTE — Patient Instructions (Signed)
Your physician has requested that you have an exercise tolerance test. For further information please visit [email or web address] Please also follow instruction sheet, as given. This can be scheduled with Ward Givens, NP Wednesday, or Friday of this week, or Monday of the next.  Your physician recommends that you schedule a follow-up appointment in: [place] in 1 month.

## 2011-11-27 ENCOUNTER — Encounter: Payer: Self-pay | Admitting: Family Medicine

## 2011-11-30 ENCOUNTER — Other Ambulatory Visit: Payer: Self-pay | Admitting: Family Medicine

## 2011-11-30 MED ORDER — SIMVASTATIN 40 MG PO TABS: 40.0000 mg | ORAL_TABLET | Freq: Every day | ORAL | Status: DC

## 2011-12-11 ENCOUNTER — Ambulatory Visit (INDEPENDENT_AMBULATORY_CARE_PROVIDER_SITE_OTHER): Payer: BC Managed Care – PPO | Admitting: Internal Medicine

## 2011-12-11 ENCOUNTER — Encounter: Payer: Self-pay | Admitting: Internal Medicine

## 2011-12-11 VITALS — BP 120/78 | HR 60 | Ht 71.0 in | Wt 276.0 lb

## 2011-12-11 DIAGNOSIS — K219 Gastro-esophageal reflux disease without esophagitis: Secondary | ICD-10-CM

## 2011-12-11 DIAGNOSIS — Z86010 Personal history of colonic polyps: Secondary | ICD-10-CM

## 2011-12-11 DIAGNOSIS — R079 Chest pain, unspecified: Secondary | ICD-10-CM

## 2011-12-11 NOTE — Progress Notes (Signed)
Subjective:    Patient ID: Brett Shaw, male    DOB: [birth year], 51 y.o.   MRN: [id number]  Referred by: Nestor Ramp, MD [id number] [place], [place] [address]  HPI This 51 yo African-American man presents for evaluation of chest pain and suspected GERD. He has a long-standing history of GERD and typical symptoms that had been relieved by PPI. He has undergone EGD several years ago when in [place] and working in the [employer] and [employer]. More recently, over the past several months he has been having central chest pain that radiates outward, despite taking Nexium 40 mg bid. The pain is not necessarily caused bu food and can be exertional. It is usually self-limited and relieved with rest.There has been associated vague dyspnea at times. He has been to the ED. He has seen cardiology and a stress test is schedule for 3 days from now. He changed back to omeprazole 40 mg daily due to lack of efficacy and cost of Nexium and says his symptoms are less frequent. He is not taking it every day. Denies dysphagia.  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 Repetitive motion with bending and lifting sometimes causes abdominal discomfort. He has been under some stress caring for and living with elderly demented mother in her late 7's.  Colonoscopy in [place] apparently showed polyps that were removed - he reports  GI ROS otherwise negative. Review of Systems 20# weight loss intentional since Dec All other ROs negative except as in HPI     Allergies  Allergen Reactions  . Shellfish Allergy    Outpatient Prescriptions Prior to Visit  Medication Sig Dispense Refill  . aspirin EC 81 MG tablet Take 1 tablet (81 mg total) by mouth daily.      . cholecalciferol (VITAMIN D) 1000 UNITS tablet Take 1,000 Units by mouth daily.      . Cyanocobalamin (B-12) 1000 MCG CAPS Take by mouth daily.      . Multiple Vitamin (MULITIVITAMIN WITH MINERALS)  TABS Take 1 tablet by mouth daily.      Marland Kitchen omeprazole (PRILOSEC) 40 MG capsule Take 40 mg by mouth Twice daily.      . simvastatin (ZOCOR) 40 MG tablet Take 1 tablet (40 mg total) by mouth at bedtime.  30 tablet  12   Past Medical History  Diagnosis Date  . Hypertension   . GERD (gastroesophageal reflux disease)   . Hyperlipidemia   . PVC's (premature ventricular contractions)     a. noted on holter 08/2011  . Hiatal hernia    History reviewed. No pertinent past surgical history. History  Prior EGD and colonoscopy (? Polyps) in [place] Social History  . Marital Status: Single    Spouse Name: N/A    Number of Children: 1  .     Occupational History  . Assembly Worker    Social History Main Topics  . Smoking status: Never Smoker   . Smokeless tobacco: Never Used  . Alcohol Use: Yes     beer 3 to 4 weekly  . Drug Use: No    Social History Narrative  . Single, works at [employer], lives with elderly demented mother   Family History  Problem Relation Age of Onset  . Diabetes Other   . Heart failure Other   . Heart attack Brother 39  . Heart disease Brother   . Diabetes Brother   .  Colon cancer    . Heart disease Father   . Diabetes Sister   . Diabetes Maternal Grandmother   . Diabetes Maternal Grandfather   . Diabetes Paternal Grandmother   . Diabetes Paternal Grandfather         Objective:   Physical Exam General:  Well-developed, well-nourished and in no acute distress-obese Eyes:  anicteric. ENT:   Mouth and posterior pharynx free of lesions.  Neck:   supple w/o thyromegaly or mass.  Lungs: Clear to auscultation bilaterally. Heart:  S1S2, no rubs, murmurs, gallops. Abdomen:  soft, non-tender, no hepatosplenomegaly, hernia, or mass and BS+.  Lymph:  no cervical or supraclavicular adenopathy. Extremities:   no edema Skin   no rash. Neuro:  A&O x 3.  Psych:  appropriate mood and  Affect.       Assessment & Plan:   1. GERD (gastroesophageal reflux  disease)   It sounds like he does have this but ? If only cause of chest pain. To stay on omeprazole 40 mg and take qd regardless of sxs or not. Await cardiac stress test results before any other changes. Doubt EGd would be needed.  2. Chest pain   Has some cardiac features also. Await stress test. He has some situational stressors that may be a part of this also.  3. Personal history of colonic polyps   No pathology or documentation but he is almost 51 so a screening/possible surveillance colonoscopy would be prudent - pending cardiac evaluation.   Return here in 6 weeks to reassess and regroup.  Cc: Denny Levy, MD, and Nicolasa Ducking, ANP

## 2011-12-11 NOTE — Patient Instructions (Signed)
Please follow up with [place] in 6 weeks.

## 2011-12-12 ENCOUNTER — Encounter: Payer: Self-pay | Admitting: Internal Medicine

## 2011-12-14 ENCOUNTER — Encounter: Payer: Self-pay | Admitting: Nurse Practitioner

## 2011-12-14 ENCOUNTER — Ambulatory Visit (INDEPENDENT_AMBULATORY_CARE_PROVIDER_SITE_OTHER): Payer: BC Managed Care – PPO | Admitting: Nurse Practitioner

## 2011-12-14 DIAGNOSIS — R072 Precordial pain: Secondary | ICD-10-CM

## 2011-12-14 NOTE — Procedures (Signed)
Exercise Treadmill Test  Pre-Exercise Testing Evaluation Rhythm: normal sinus  Rate: 76   PR:  .16 QRS:  .11  QT:  .37 QTc: .41     Test  Exercise Tolerance Test Ordering MD: Marca Ancona, MD  Interpreting MD: Ward Givens, NP  Unique Test No: 1  Treadmill:  1  Indication for ETT: chest pain - rule out ischemia  Contraindication to ETT: No   Stress Modality: exercise - treadmill  Cardiac Imaging Performed: non   Protocol: standard Bruce - maximal  Max BP:  155/76  Max MPHR (bpm):  171 85% MPR (bpm):  145  MPHR obtained (bpm):  153 % MPHR obtained:  89%  Reached 85% MPHR (min:sec):  5:33 Total Exercise Time (min-sec):  7:13  Workload in METS:  9.0 Borg Scale: 17  Reason ETT Terminated:  dyspnea    ST Segment Analysis At Rest: normal ST segments - no evidence of significant ST depression With Exercise: no evidence of significant ST depression  Other Information Arrhythmia:  rare pvc Angina during ETT:  absent (0) Quality of ETT:  diagnostic  ETT Interpretation:  normal - no evidence of ischemia by ST analysis  Comments: No acute st/t changes.  No chest pain/sob.  Recommendations: No further ischemic eval.

## 2012-01-13 ENCOUNTER — Ambulatory Visit: Payer: BC Managed Care – PPO | Admitting: Cardiology

## 2012-01-26 ENCOUNTER — Encounter: Payer: Self-pay | Admitting: Internal Medicine

## 2012-01-26 ENCOUNTER — Ambulatory Visit (INDEPENDENT_AMBULATORY_CARE_PROVIDER_SITE_OTHER): Payer: BC Managed Care – PPO | Admitting: Internal Medicine

## 2012-01-26 VITALS — BP 128/80 | HR 80 | Ht 71.0 in | Wt 281.8 lb

## 2012-01-26 DIAGNOSIS — K219 Gastro-esophageal reflux disease without esophagitis: Secondary | ICD-10-CM

## 2012-01-26 DIAGNOSIS — R079 Chest pain, unspecified: Secondary | ICD-10-CM

## 2012-01-26 DIAGNOSIS — R072 Precordial pain: Secondary | ICD-10-CM

## 2012-01-26 DIAGNOSIS — R0789 Other chest pain: Secondary | ICD-10-CM

## 2012-01-26 DIAGNOSIS — Z1211 Encounter for screening for malignant neoplasm of colon: Secondary | ICD-10-CM

## 2012-01-26 MED ORDER — MOVIPREP 100 G PO SOLR: ORAL | Status: DC

## 2012-01-26 NOTE — Progress Notes (Signed)
Patient ID: Brett Shaw, male   DOB: 19-Dec-1960, 51 y.o.   MRN: [id number]  This very pleasant 51 year old [place] American man presents for followup of GERD and chest pain. When last seen he was having chest pain problems intermittently. It sounds like he had a background history of GERD. He was on omeprazole 40 mg daily. He had a stress test evaluation that was negative. He has been on family medical leave helping his mother for the past 3 weeks and has not been working though his mother has been ill he feels like there has been less stress. He has not had chest pain in weeks. He is using the omeprazole in an intermittent as needed basis for heartburn at this point.  Medications, allergies, past medical history, past surgical history, family history and social history are reviewed and updated in the EMR.   Physical exam: Limited to vital signs in general appearance, he is overweight and in no acute distress  Assessment and plan:  1. GERD (gastroesophageal reflux disease)   2. Chest pain   Not sure how much of the chest pain was anxiety versus GERD or both. He is doing well at this time and we'll continue with intermittent PPI.   3. Special screening for malignant neoplasms, colon   He will be 51 tomorrow He is asked to schedule a screening colonoscopy. We will do so. The risks and benefits as well as alternatives of endoscopic procedure(s) have been discussed and reviewed. All questions answered. The patient agrees to proceed.

## 2012-01-26 NOTE — Patient Instructions (Addendum)
You have been scheduled for a colonoscopy with propofol. Please follow written instructions given to you at your visit today.  Please pick up your prep kit at the pharmacy within the next 1-3 days.  

## 2012-01-27 ENCOUNTER — Encounter: Payer: Self-pay | Admitting: Internal Medicine

## 2012-03-07 ENCOUNTER — Ambulatory Visit (AMBULATORY_SURGERY_CENTER): Payer: BC Managed Care – PPO | Admitting: Internal Medicine

## 2012-03-07 ENCOUNTER — Encounter: Payer: Self-pay | Admitting: Internal Medicine

## 2012-03-07 VITALS — BP 152/71 | HR 78 | Temp 97.0°F | Resp 18 | Ht 71.0 in | Wt 281.0 lb

## 2012-03-07 DIAGNOSIS — Z1211 Encounter for screening for malignant neoplasm of colon: Secondary | ICD-10-CM

## 2012-03-07 MED ORDER — SODIUM CHLORIDE 0.9 % IV SOLN: 500.0000 mL | INTRAVENOUS | Status: DC

## 2012-03-07 NOTE — Op Note (Signed)
[facility] [address] [place], [place]  [address]  COLONOSCOPY PROCEDURE REPORT  PATIENT:  Brett, Shaw  MR#:  [id number] BIRTHDATE:  1961/04/21, 51 yrs. old  GENDER:  male ENDOSCOPIST:  Iva Boop, MD, [facility]  PROCEDURE DATE:  03/07/2012 PROCEDURE:  Colonoscopy 30865 ASA CLASS:  Class III INDICATIONS:  Routine Risk Screening MEDICATIONS:   These medications were titrated to patient response per physician's verbal order, MAC sedation, administered by CRNA, propofol (Diprivan) 130 mg IV  DESCRIPTION OF PROCEDURE:   After the risks benefits and alternatives of the procedure were thoroughly explained, informed consent was obtained.  Digital rectal exam was performed and revealed no abnormalities and normal prostate.   The LB CF-H180AL [id number] endoscope was introduced through the anus and advanced to the cecum, which was identified by both the appendix and ileocecal valve, without limitations.  The quality of the prep was good, using MoviPrep.  The instrument was then slowly withdrawn as the colon was fully examined. <<PROCEDUREIMAGES>>  FINDINGS:  A normal appearing cecum, ileocecal valve, and appendiceal orifice were identified. The ascending, hepatic flexure, transverse, splenic flexure, descending, sigmoid colon, and rectum appeared unremarkable.   Retroflexed views in the right colon and rectum revealed no abnormalities.    The time to cecum = 1:20 minutes. The scope was then withdrawn in 7:14 minutes from the cecum and the procedure completed. COMPLICATIONS:  None ENDOSCOPIC IMPRESSION: 1) Normal colonoscopy, excellent prep  REPEAT EXAM:  In 10 year(s) for routine screening colonoscopy. 2023  Iva Boop, MD, [facility]  CC:  Denny Levy, MD and The Patient  n. eSIGNED:   Iva Boop at 03/07/2012 01:53 PM  Karle Barr, [phone]

## 2012-03-07 NOTE — Patient Instructions (Addendum)
Your colonoscopy was normal. This is good news!  Next routine colonoscopy in about 10 years (2023).  See me as needed until then.  Thank you for choosing me and [facility].  Iva Boop, MD, FACG  YOU HAD AN ENDOSCOPIC PROCEDURE TODAY AT THE [facility]: Refer to the procedure report that was given to you for any specific questions about what was found during the examination.  If the procedure report does not answer your questions, please call your gastroenterologist to clarify.  If you requested that your care partner not be given the details of your procedure findings, then the procedure report has been included in a sealed envelope for you to review at your convenience later.  YOU SHOULD EXPECT: Some feelings of bloating in the abdomen. Passage of more gas than usual.  Walking can help get rid of the air that was put into your GI tract during the procedure and reduce the bloating. If you had a lower endoscopy (such as a colonoscopy or flexible sigmoidoscopy) you may notice spotting of blood in your stool or on the toilet paper. If you underwent a bowel prep for your procedure, then you may not have a normal bowel movement for a few days.  DIET: Your first meal following the procedure should be a light meal and then it is ok to progress to your normal diet.  A half-sandwich or bowl of soup is an example of a good first meal.  Heavy or fried foods are harder to digest and may make you feel nauseous or bloated.  Likewise meals heavy in dairy and vegetables can cause extra gas to form and this can also increase the bloating.  Drink plenty of fluids but you should avoid alcoholic beverages for 24 hours.  ACTIVITY: Your care partner should take you home directly after the procedure.  You should plan to take it easy, moving slowly for the rest of the day.  You can resume normal activity the day after the procedure however you should NOT DRIVE or use heavy machinery for 24  hours (because of the sedation medicines used during the test).    SYMPTOMS TO REPORT IMMEDIATELY: A gastroenterologist can be reached at any hour.  During normal business hours, 8:30 AM to 5:00 PM Monday through Friday, call [phone].  After hours and on weekends, please call the GI answering service at [phone] who will take a message and have the physician on call contact you.   Following lower endoscopy (colonoscopy or flexible sigmoidoscopy):  Excessive amounts of blood in the stool  Significant tenderness or worsening of abdominal pains  Swelling of the abdomen that is new, acute  Fever of 100F or higher  Following upper endoscopy (EGD)  Vomiting of blood or coffee ground material  New chest pain or pain under the shoulder blades  Painful or persistently difficult swallowing  New shortness of breath  Fever of 100F or higher  Black, tarry-looking stools  FOLLOW UP: If any biopsies were taken you will be contacted by phone or by letter within the next 1-3 weeks.  Call your gastroenterologist if you have not heard about the biopsies in 3 weeks.  Our staff will call the home number listed on your records the next business day following your procedure to check on you and address any questions or concerns that you may have at that time regarding the information given to you following your procedure. This is a courtesy call and so if there  is no answer at the home number and we have not heard from you through the emergency physician on call, we will assume that you have returned to your regular daily activities without incident.  SIGNATURES/CONFIDENTIALITY: You and/or your care partner have signed paperwork which will be entered into your electronic medical record.  These signatures attest to the fact that that the information above on your After Visit Summary has been reviewed and is understood.  Full responsibility of the confidentiality of this discharge information lies with  you and/or your care-partner.

## 2012-03-07 NOTE — Progress Notes (Signed)
Patient did not experience any of the following events: a burn prior to discharge; a fall within the facility; wrong site/side/patient/procedure/implant event; or a hospital transfer or hospital admission upon discharge from the facility. (G8907) Patient did not have preoperative order for IV antibiotic SSI prophylaxis. (G8918)  

## 2012-03-08 ENCOUNTER — Telehealth: Payer: Self-pay | Admitting: *Deleted

## 2012-03-08 NOTE — Telephone Encounter (Signed)
  Follow up Call-  Call back number 03/07/2012  Post procedure Call Back phone  # [phone]  Permission to leave phone message Yes     Patient questions:  Do you have a fever, pain , or abdominal swelling? no Pain Score  0 *  Have you tolerated food without any problems? yes  Have you been able to return to your normal activities? no  Patient states that he is "a little weak, and called into work."  Do you have any questions about your discharge instructions: Diet   no Medications  no Follow up visit  no  Do you have questions or concerns about your Care? no  Actions: * If pain score is 4 or above: No action needed, pain <4.

## 2012-04-18 ENCOUNTER — Ambulatory Visit (INDEPENDENT_AMBULATORY_CARE_PROVIDER_SITE_OTHER): Payer: BC Managed Care – PPO | Admitting: Family Medicine

## 2012-04-18 ENCOUNTER — Encounter: Payer: Self-pay | Admitting: Family Medicine

## 2012-04-18 VITALS — BP 149/89 | Ht 71.0 in | Wt 282.0 lb

## 2012-04-18 DIAGNOSIS — K829 Disease of gallbladder, unspecified: Secondary | ICD-10-CM

## 2012-04-18 DIAGNOSIS — R072 Precordial pain: Secondary | ICD-10-CM

## 2012-04-18 DIAGNOSIS — R0789 Other chest pain: Secondary | ICD-10-CM

## 2012-04-18 NOTE — Progress Notes (Signed)
  Subjective:    Patient ID: Brett Shaw, male    DOB: October 19, 1960, 51 y.o.   MRN: [id number]  HPI  3 weeks of right shoulder blade pain. It starts in his right chest wall underneath the axilla encounter radiates to the right shoulder. Feels like a very sharp gas pain. He notices it a lot when he bends over and exerts himself. He has not noticed it occurring specifically with or without food. It seems to come and go it has done well and nothing that really makes it better. Intermittent. Can be 6 or 7/10 pain but that usually only lasts for an hour or so. Has not awakened him from sleep. He has continued to do his regular job as well has increased amount of time he is taking care of his mother his health is failing.   PERTINENT  PMH / PSH: Had exercise treadmill test and Holter both or which were negative for any signs of ischemia done earlier this year.  Has had screening colonoscopy. Negative. Review of Systems Denies nausea, vomiting, unusual weight change. Denies heartburn. No bowel or bladder changes.    Objective:   Physical Exam Vital signs reviewed. GENERAL: Well-developed, well-nourished, no acute distress. CARDIOVASCULAR: Regular rate and rhythm no murmur gallop or rub LUNGS: Clear to auscultation bilaterally, no rales or wheeze. ABDOMEN: Soft positive bowel sounds.  No masses are noted although this exam is limited somewhat by his habitus. Negative Murphy sign. No rebound, no guarding. BACK: Nontender to percussion. No CVA tenderness. The right scapular area is nontender to percussion or palpation. MSK: Movement of extremity x 4.         Assessment & Plan:  #1. Right shoulder pain that I think is probably his symptoms secondary to cholecystitis. We'll get abdominal ultrasound I'll call him back. We did discuss gallbladder disease, gallstones, gallbladder attack at some length. I gave him warning signs or red flags" such is severe pain with nausea vomiting sweats-for these he  should go to the emergency room.

## 2012-04-18 NOTE — Patient Instructions (Addendum)
You have been scheduled for an ultrasound of you gallbladder on 04/20/12 at 8:30am, please arrive at 1st floor radiology at 8:15am at [facility].

## 2012-04-20 ENCOUNTER — Ambulatory Visit (HOSPITAL_COMMUNITY)
Admission: RE | Admit: 2012-04-20 | Discharge: 2012-04-20 | Disposition: A | Discharge status: Home / Self Care | Payer: BC Managed Care – PPO | Source: Ambulatory Visit | Attending: Family Medicine | Admitting: Family Medicine

## 2012-04-20 ENCOUNTER — Telehealth: Payer: Self-pay | Admitting: Family Medicine

## 2012-04-20 DIAGNOSIS — K7689 Other specified diseases of liver: Secondary | ICD-10-CM | POA: Insufficient documentation

## 2012-04-20 DIAGNOSIS — K829 Disease of gallbladder, unspecified: Secondary | ICD-10-CM

## 2012-04-20 NOTE — Telephone Encounter (Signed)
Patient regarding gallbladder ultrasound results which are normal. There was noted a large amount of abdominal gas. I think this is likely causing his symptoms. He has recently increased intake of soda particularly ginger ale. We'll have him decrease that to one per day or less, have him add over-the-counter simethicone. If his symptoms resolve then we need to do nothing different. They do not resolve in the next 7-10 days she will call me back. He is taking his proton pump inhibitor when necessary.

## 2012-12-07 ENCOUNTER — Other Ambulatory Visit: Payer: Self-pay | Admitting: Family Medicine

## 2012-12-08 ENCOUNTER — Telehealth: Payer: Self-pay | Admitting: *Deleted

## 2012-12-08 MED ORDER — OMEPRAZOLE 40 MG PO CPDR
40.0000 mg | DELAYED_RELEASE_CAPSULE | Freq: Every day | ORAL | Status: DC
Start: 2012-12-08 — End: 2013-03-15

## 2012-12-08 NOTE — Telephone Encounter (Signed)
Requesting refill for Prilosec but is not on MAR. Please advise. Lakisa Lotz, Harold Hedge, RN

## 2013-01-29 IMAGING — CR DG CHEST 2V
2 series · 2 of 2 positions shown · non-contrast
Comparison: None.

CLINICAL DATA: Chest pain, labored breathing; shortness of breath.

CHEST - 2 VIEW

[w chest pa]
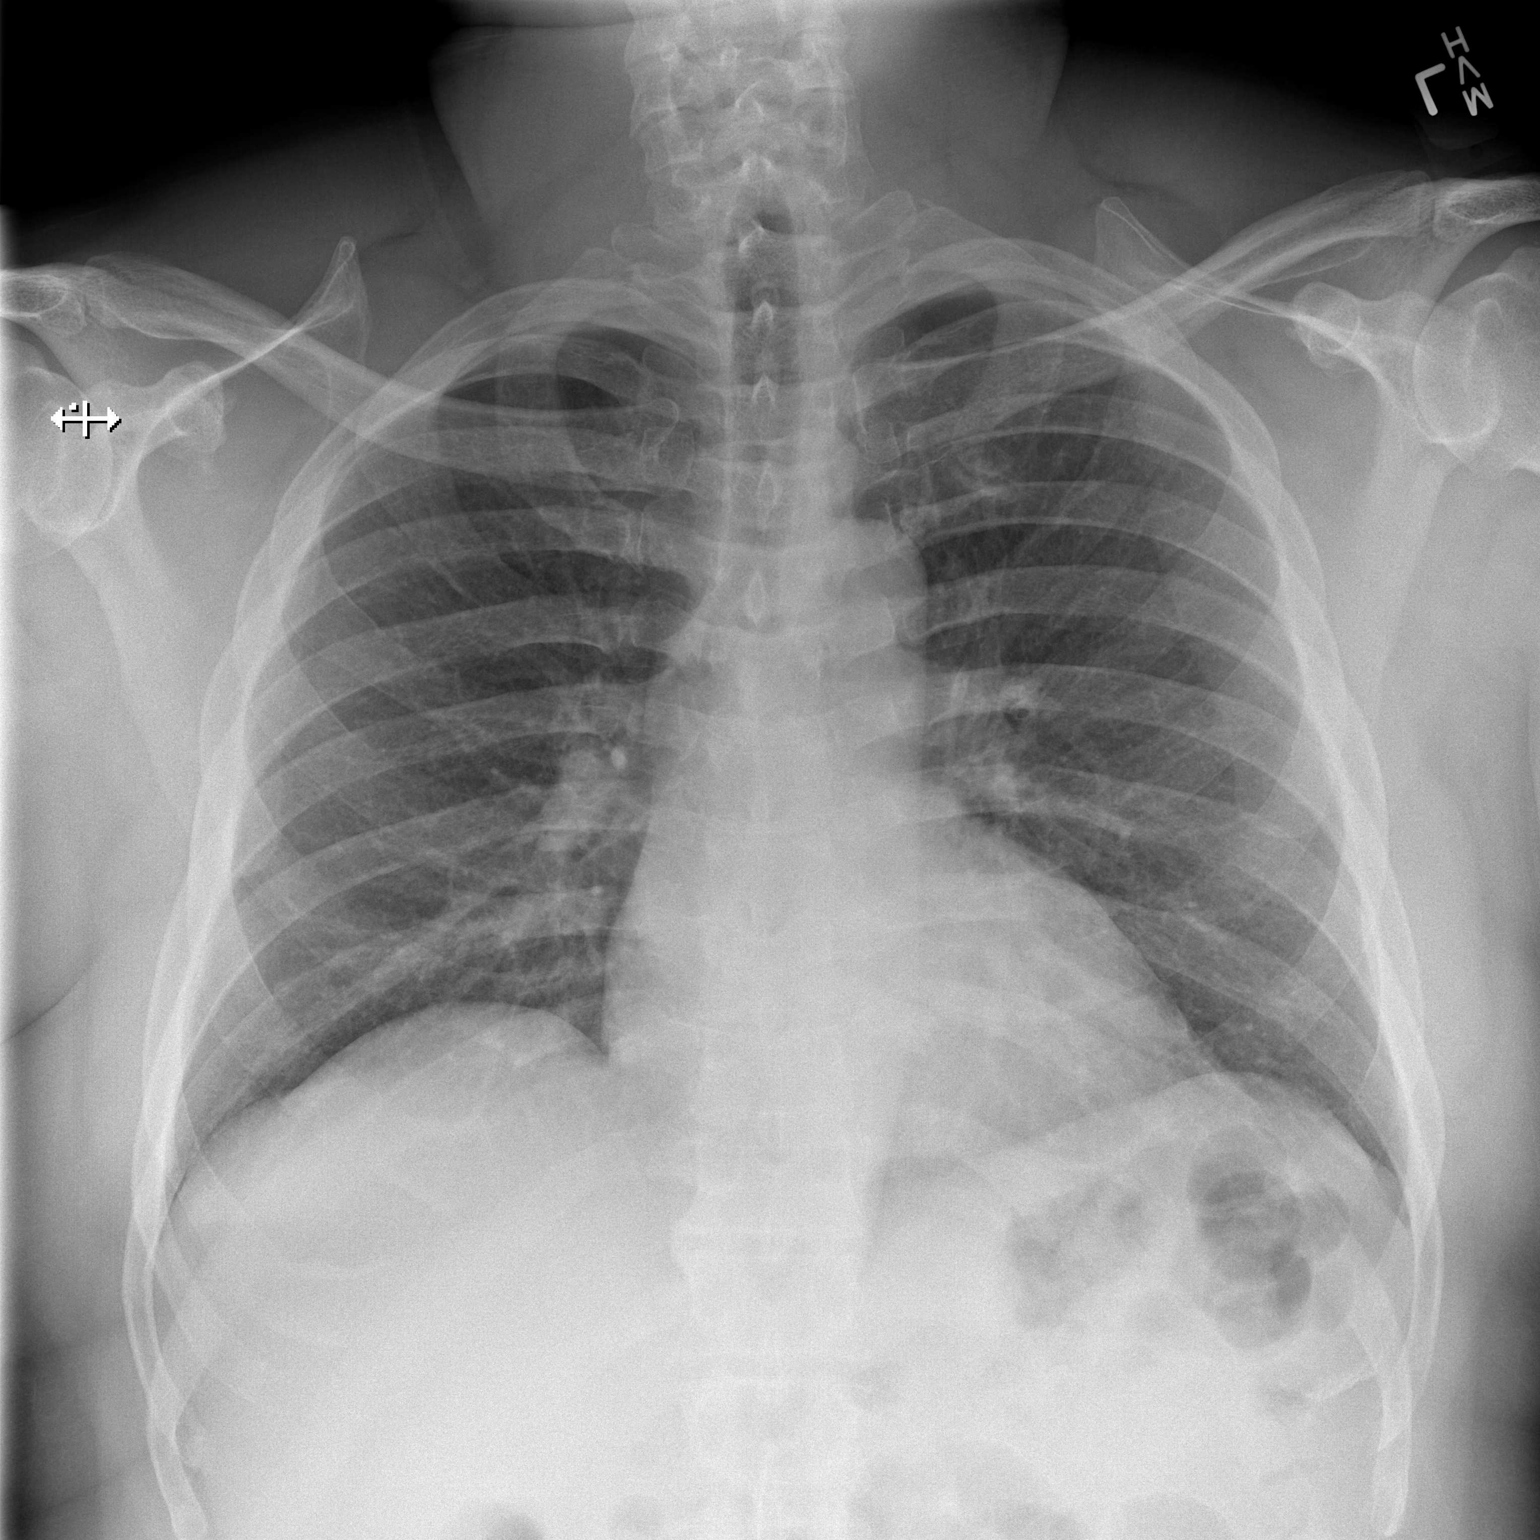

[w chest lat]
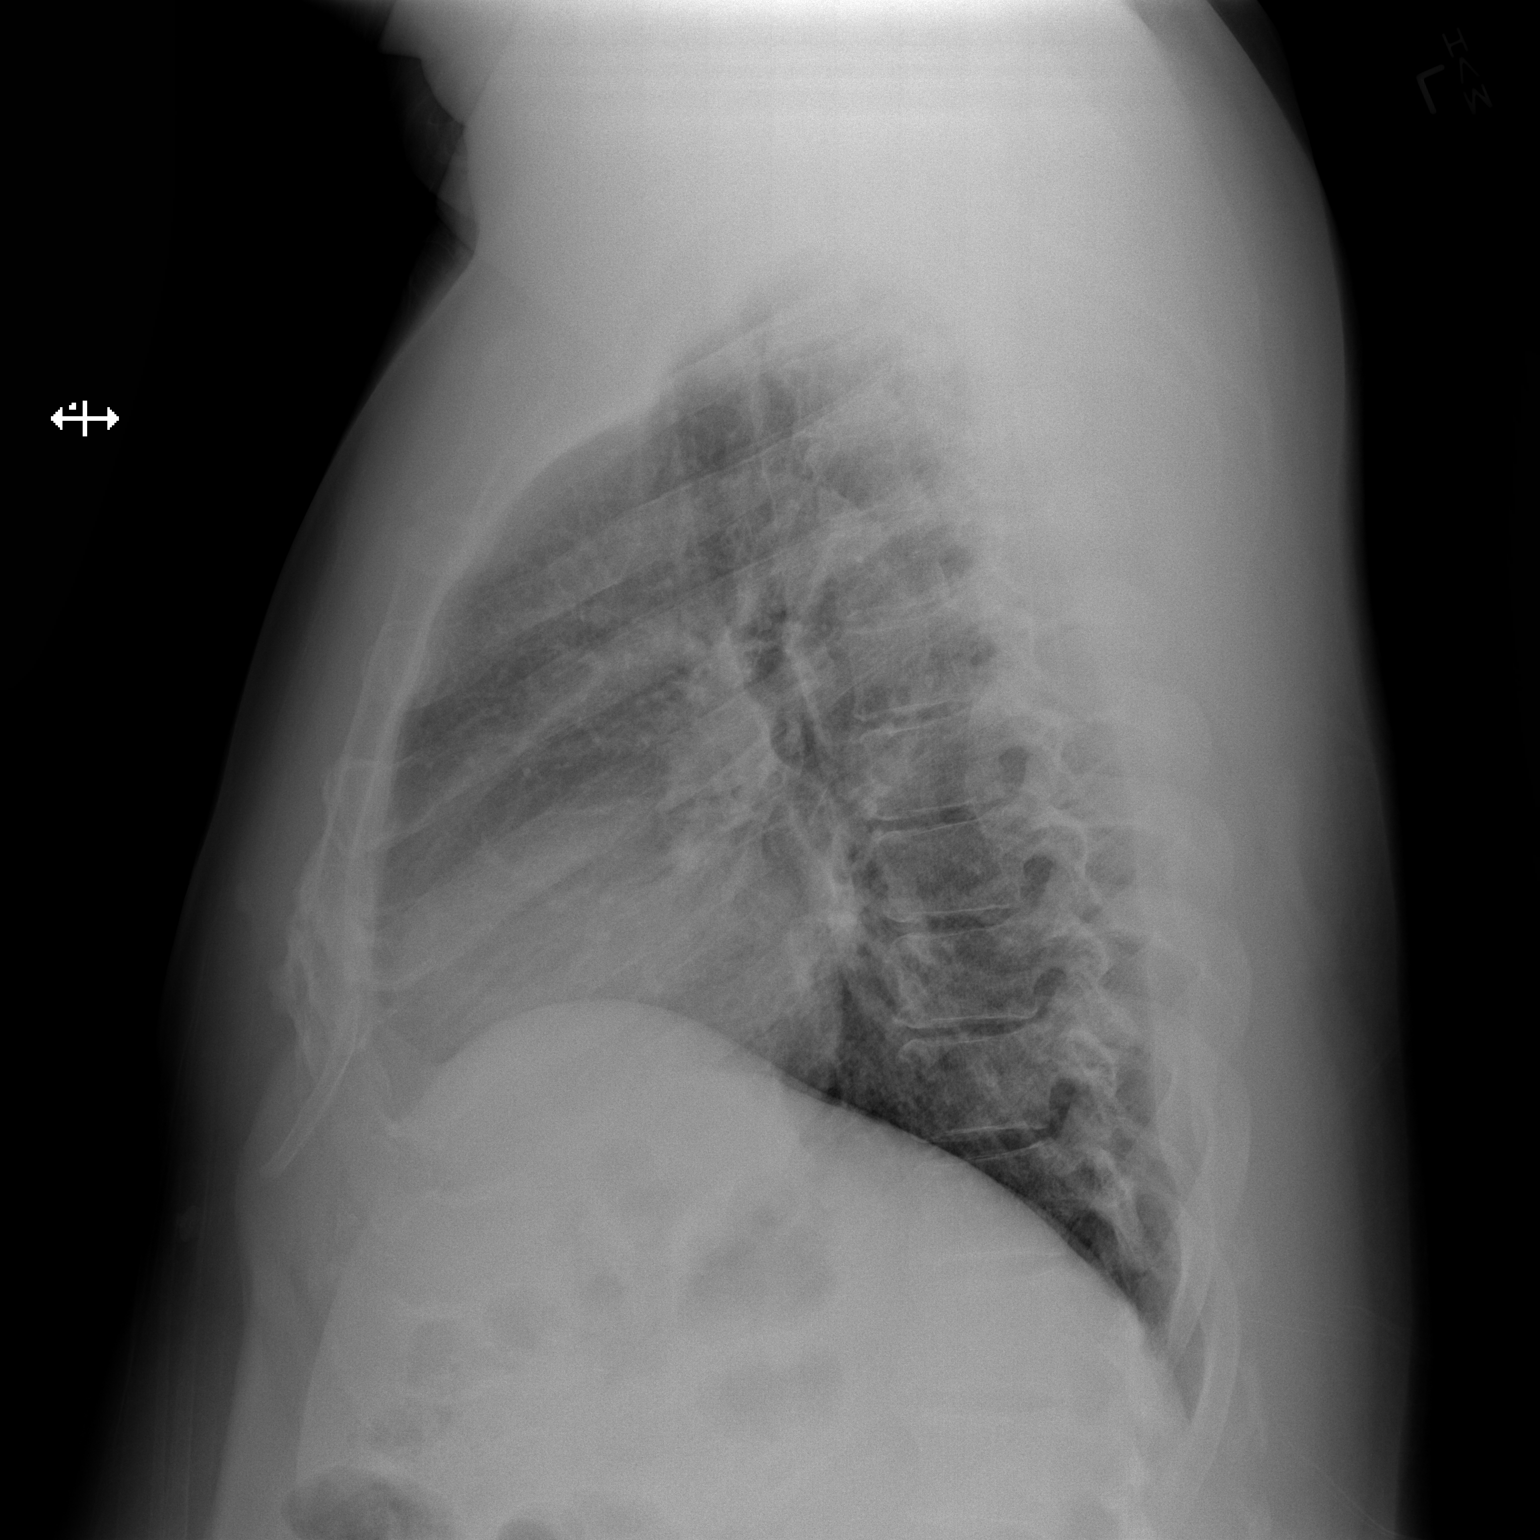

[2 of 2 positions shown; findings below may reference images not displayed]

FINDINGS: The lungs are well-aerated.  Mild bibasilar opacities
likely reflect atelectasis.  There is no evidence of pleural
effusion or pneumothorax.

The heart is normal in size; the mediastinal contour is within
normal limits.  No acute osseous abnormalities are seen.
IMPRESSION: Mild bibasilar opacities likely reflect atelectasis; lungs
otherwise clear.

## 2013-03-15 ENCOUNTER — Encounter: Payer: Self-pay | Admitting: Family Medicine

## 2013-03-15 ENCOUNTER — Ambulatory Visit (INDEPENDENT_AMBULATORY_CARE_PROVIDER_SITE_OTHER): Payer: BC Managed Care – PPO | Admitting: Family Medicine

## 2013-03-15 VITALS — BP 132/63 | HR 79 | Temp 97.8°F | Ht 70.5 in | Wt 297.2 lb

## 2013-03-15 DIAGNOSIS — Z9189 Other specified personal risk factors, not elsewhere classified: Secondary | ICD-10-CM

## 2013-03-15 DIAGNOSIS — I10 Essential (primary) hypertension: Secondary | ICD-10-CM

## 2013-03-15 DIAGNOSIS — Z0000 Encounter for general adult medical examination without abnormal findings: Secondary | ICD-10-CM

## 2013-03-15 LAB — BASIC METABOLIC PANEL
Calcium: 9.1 mg/dL (ref 8.4–10.5)
Potassium: 4 mEq/L (ref 3.5–5.3)
Sodium: 137 mEq/L (ref 135–145)

## 2013-03-15 NOTE — Assessment & Plan Note (Signed)
HTN: Diagnosis of hypertension given multiple elevated blood pressures in the past. Most recently he has been well controlled on diet and exercise. We'll check BMP

## 2013-03-15 NOTE — Progress Notes (Signed)
Subjective:     Patient ID: Brett Shaw, male   DOB: [birth year], 52 y.o.   MRN: [id number]  HPI This is a 52 yo man w/ hx of htn, and GERD in clinic for annual physical. He currently has no concerns or medical issues.   HTN States that he has never been treated for HTN. He has had episodes of increased BP in the past but also many normal and borderline BPs. Pt cites that fluctuating BP may come from stress related to taking care of his mother. Denies chest pain/pressure/palpitations, SOB, light-headedness, dizziness, numbness/cold extremities. Endorses tingling in extremities after standing for prolonged period of time at work.   GERD Well-controlled on OTC Nexium.  Health Maintenance Had unremarkable colonoscopy 1 year ago. Interested in PSA.   Fam Hx is significant for DMT2, HTN, CAD, and Obesity.   Diet - Trying to go vegetarian. Has been limiting his amount of meat intake and eating veggie burgers.  Exercise - Has gym membership but has not gone much. Is making attempts to go to the gym 3 nights/week in the evening.  Does not smoke or use tobacco products. 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 Essentially goes to work and then home.   Review of Systems General: No pain. Wt fluctuates since he is busy taking care of his mother.  GI: Normal BMs. No diarrhea, constipation, bloody/tarry stools  GU: No polyuria, dysuria, hematuria  Rest of ROS as per HPI    Objective:   Physical Exam BP 132/63  Pulse 79  Temp(Src) 97.8 F (36.6 C) (Oral)  Ht 5' 10.5" (1.791 m)  Wt 297 lb 3.2 oz (134.809 kg)  BMI 42.03 kg/m2 Vital signs reviewed GENERALl: Well developed, well nourished, in no acute distress. NECK: Supple, FROM, without lymphadenopathy.  THYROID: normal without nodularity CAROTID ARTERIES: without bruits LUNGS: clear to auscultation bilaterally. No wheezes or rales. HEART: Regular rate and rhythm, no murmurs ABDOMEN: soft with positive bowel  sounds MSK: MOE x 4 SKIN no rash NEURO: no focal deficits   .  10-year ASCVD risk: 6.4%      Assessment:     HTN: Diagnosis of hypertension given multiple elevated blood pressures in the past. Most recently he has been well controlled on diet and exercise. We'll check BMP. #2. Preventive health maintenance. He had his colonoscopy. We discussed other screening he chose to get PSA drawn. We discussed exercise.    Plan:      F/u in 3 mo or sooner if needed.

## 2013-03-27 ENCOUNTER — Encounter: Payer: Self-pay | Admitting: Family Medicine

## 2013-07-05 IMAGING — US US ABDOMEN COMPLETE
1 series · 14 of 25 positions shown · non-contrast
Comparison: CT scan 06/02/2006

CLINICAL DATA: Assess gallbladder

COMPLETE ABDOMINAL ULTRASOUND

[Series 1: us abdomen complete · 0.28mm/px · 14 of 73 slices shown]
[im 1/73]
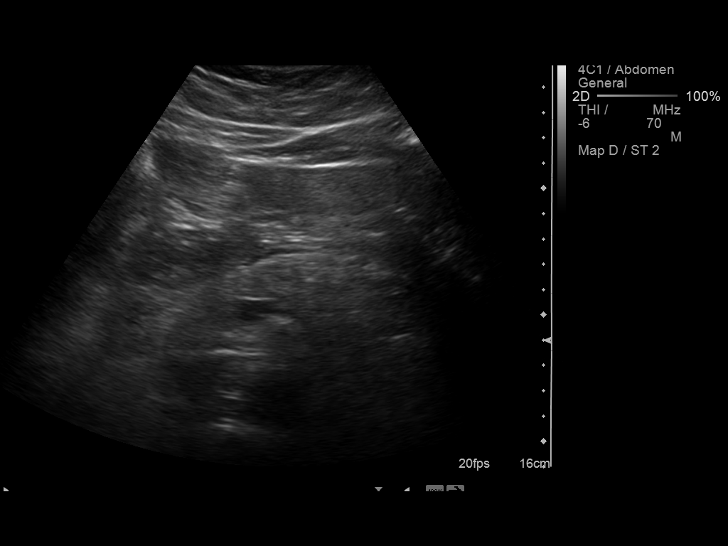
[im 7/73]
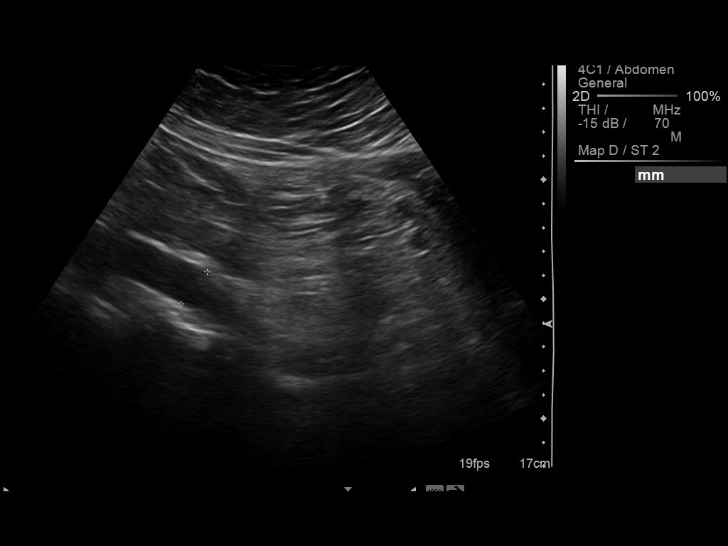
[im 13/73]
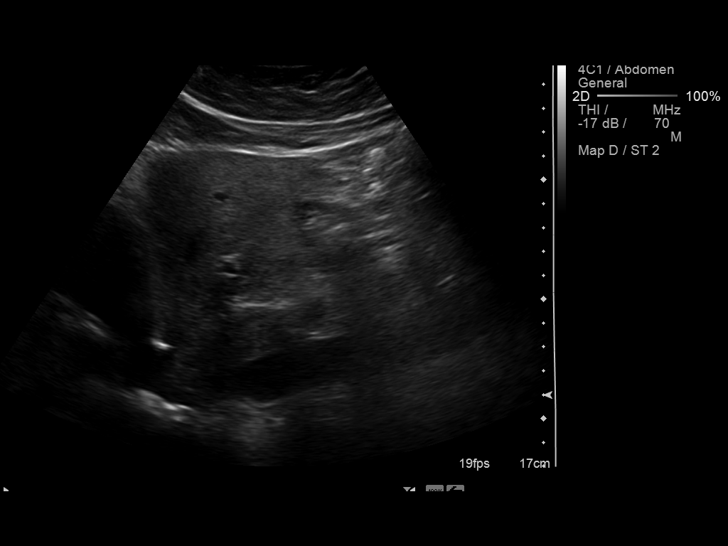
[im 19/73]
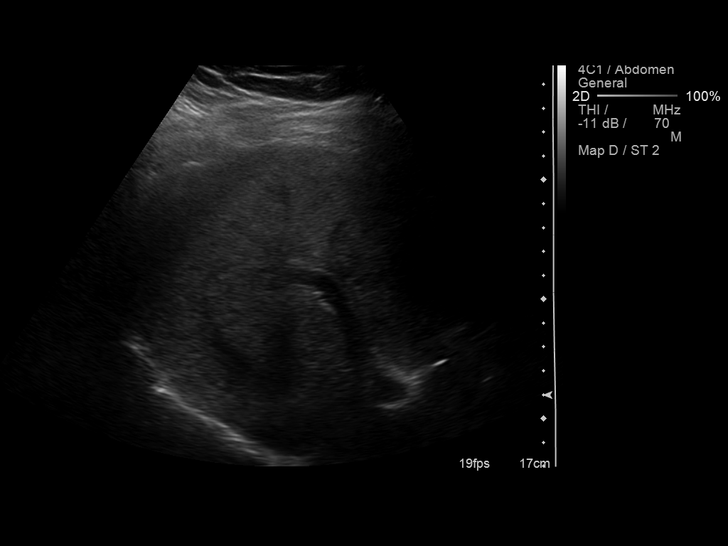
[im 25/73]
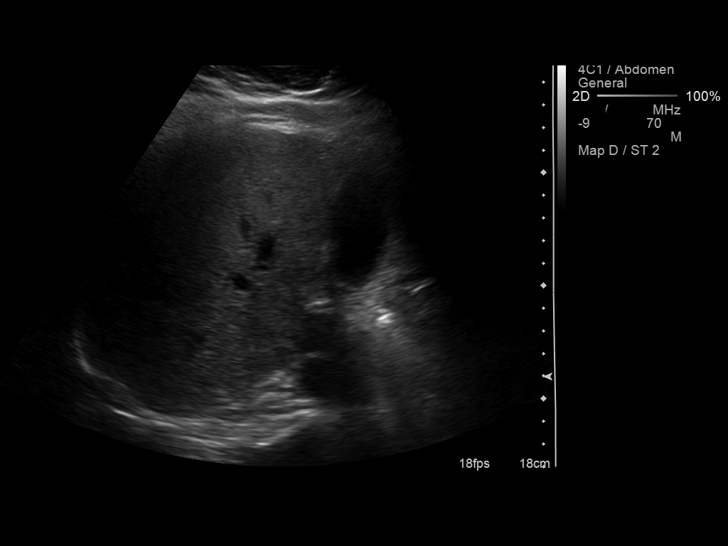
[im 28/73]
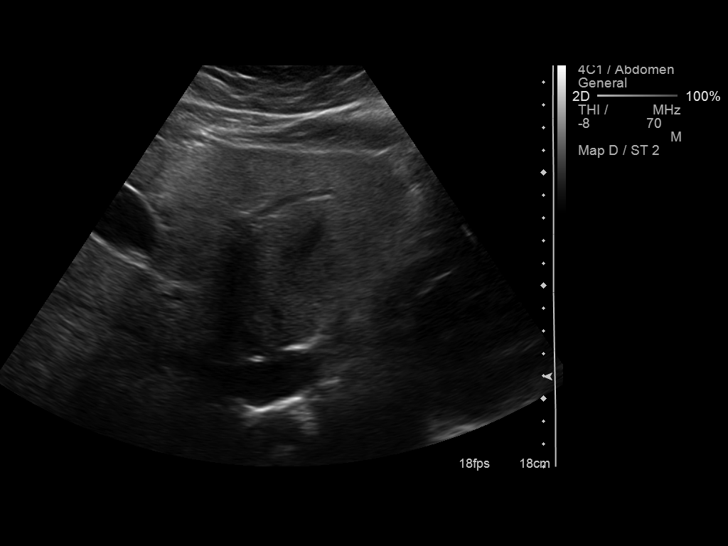
[im 34/73]
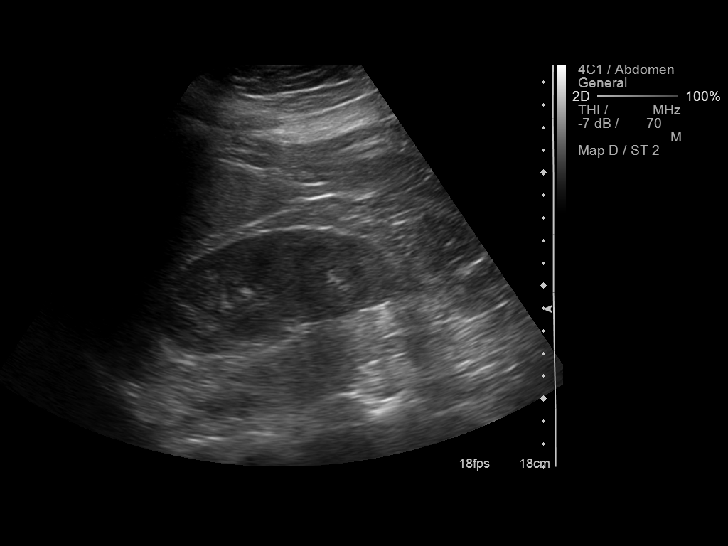
[im 40/73]
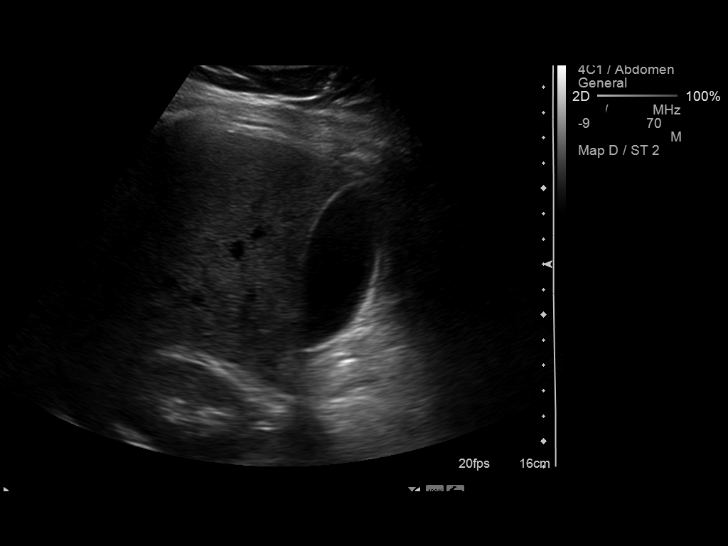
[im 46/73]
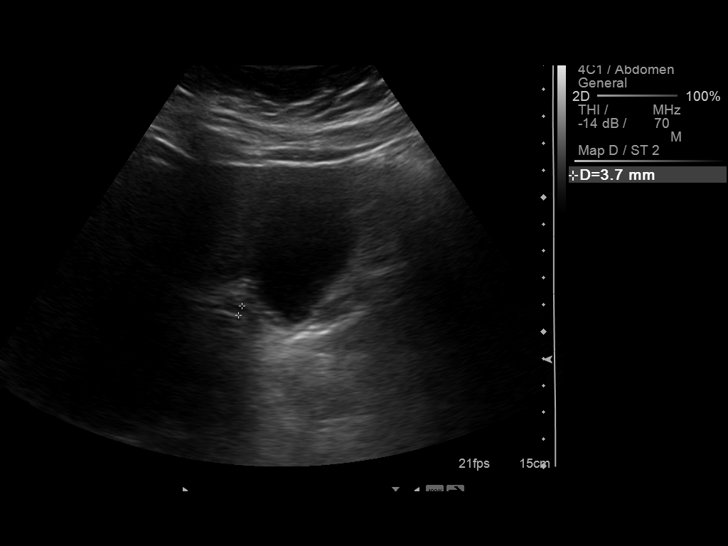
[im 49/73]
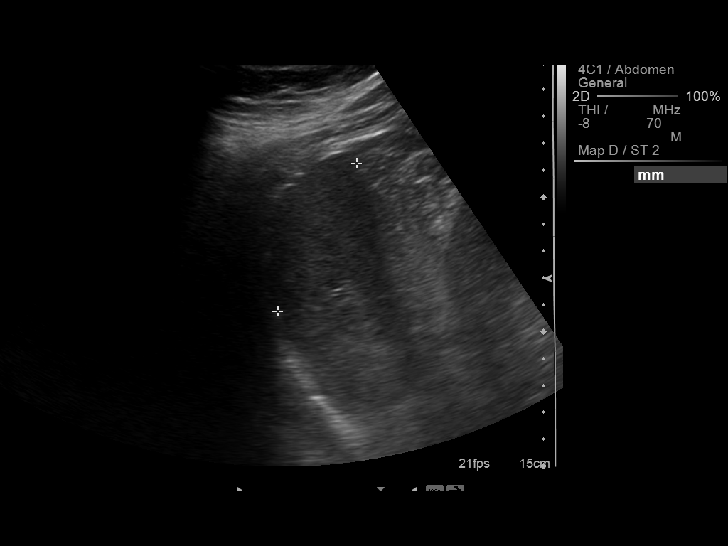
[im 55/73]
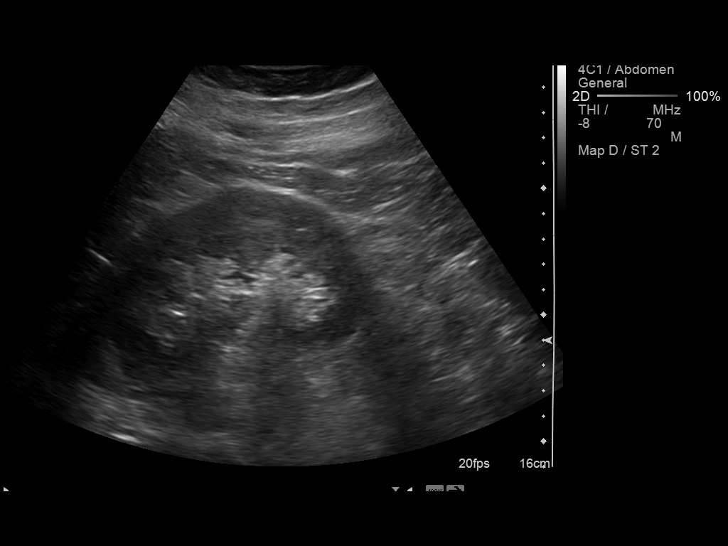
[im 61/73]
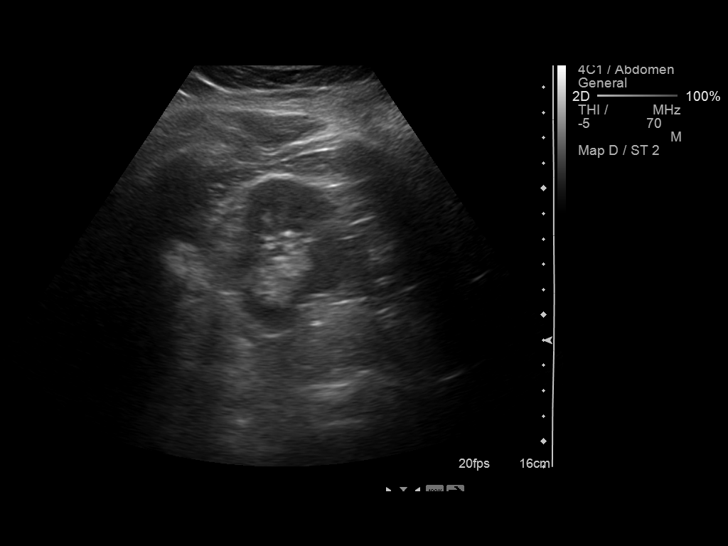
[im 67/73]
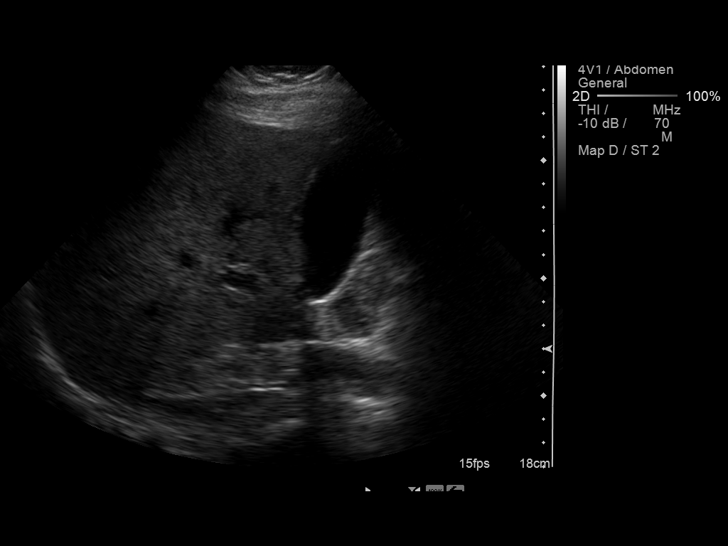
[im 73/73]
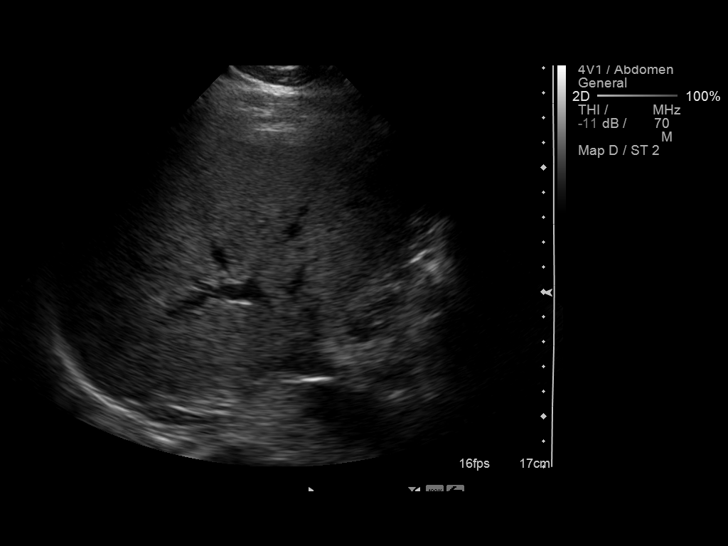

[14 of 25 positions shown; findings below may reference images not displayed]

FINDINGS: Gallbladder:  No gallstones, gallbladder wall thickening, or
pericholecystic fluid. No sonographic Murphy's sign.

Common bile duct:  Measures 3.7 mm in diameter within normal
limits.

Liver:  No focal lesion identified. Liver shows coarse diffuse
increased echogenicity consistent with fatty infiltration.  No
intrahepatic biliary ductal dilatation.

IVC:  Appears normal.

Pancreas:  No focal abnormality seen. Limited assessment of
pancreatic tail due to abundant bowel gas.

Spleen:  Measures 0.2 cm in length.  Normal echogenicity.

Right Kidney:  Measures 11.9 cm in length.  No mass, hydronephrosis
or diagnostic renal calculus

Left Kidney:  Measures 11.2 cm in length.  No mass, hydronephrosis
or diagnostic renal calculus

Abdominal aorta:  No aneurysm identified. Measures up to 2.2 cm in
diameter.
IMPRESSION: 1.  No gallstones are noted within gallbladder.  Normal CBD.
2.  Fatty infiltration of the liver.
3.  No hydronephrosis or diagnostic renal calculus.

## 2013-12-20 ENCOUNTER — Ambulatory Visit (INDEPENDENT_AMBULATORY_CARE_PROVIDER_SITE_OTHER): Payer: BC Managed Care – PPO | Admitting: Family Medicine

## 2013-12-20 ENCOUNTER — Encounter: Payer: Self-pay | Admitting: Family Medicine

## 2013-12-20 VITALS — BP 119/79 | HR 82 | Temp 98.2°F | Ht 71.0 in | Wt 295.3 lb

## 2013-12-20 DIAGNOSIS — I10 Essential (primary) hypertension: Secondary | ICD-10-CM

## 2013-12-20 DIAGNOSIS — E669 Obesity, unspecified: Secondary | ICD-10-CM

## 2013-12-20 DIAGNOSIS — L259 Unspecified contact dermatitis, unspecified cause: Secondary | ICD-10-CM

## 2013-12-20 DIAGNOSIS — R42 Dizziness and giddiness: Secondary | ICD-10-CM

## 2013-12-20 DIAGNOSIS — Z9189 Other specified personal risk factors, not elsewhere classified: Secondary | ICD-10-CM

## 2013-12-20 LAB — CBC WITH DIFFERENTIAL/PLATELET
BASOS PCT: 0 % (ref 0–1)
Basophils Absolute: 0 10*3/uL (ref 0.0–0.1)
EOS ABS: 0.1 10*3/uL (ref 0.0–0.7)
Eosinophils Relative: 1 % (ref 0–5)
HCT: 45.7 % (ref 39.0–52.0)
Hemoglobin: 15.6 g/dL (ref 13.0–17.0)
Lymphocytes Relative: 35 % (ref 12–46)
Lymphs Abs: 2.7 10*3/uL (ref 0.7–4.0)
MCH: 25.8 pg — AB (ref 26.0–34.0)
MCHC: 34.1 g/dL (ref 30.0–36.0)
MCV: 75.5 fL — ABNORMAL LOW (ref 78.0–100.0)
Monocytes Absolute: 0.9 10*3/uL (ref 0.1–1.0)
Monocytes Relative: 12 % (ref 3–12)
NEUTROS PCT: 52 % (ref 43–77)
Neutro Abs: 4.1 10*3/uL (ref 1.7–7.7)
PLATELETS: 271 10*3/uL (ref 150–400)
RBC: 6.05 MIL/uL — ABNORMAL HIGH (ref 4.22–5.81)
RDW: 14.9 % (ref 11.5–15.5)
WBC: 7.8 10*3/uL (ref 4.0–10.5)

## 2013-12-20 LAB — COMPREHENSIVE METABOLIC PANEL
ALK PHOS: 84 U/L (ref 39–117)
ALT: 22 U/L (ref 0–53)
AST: 18 U/L (ref 0–37)
Albumin: 4.3 g/dL (ref 3.5–5.2)
BILIRUBIN TOTAL: 0.5 mg/dL (ref 0.2–1.2)
BUN: 10 mg/dL (ref 6–23)
CO2: 25 mEq/L (ref 19–32)
Calcium: 9.3 mg/dL (ref 8.4–10.5)
Chloride: 105 mEq/L (ref 96–112)
Creat: 0.93 mg/dL (ref 0.50–1.35)
Glucose, Bld: 97 mg/dL (ref 70–99)
Potassium: 4.1 mEq/L (ref 3.5–5.3)
Sodium: 138 mEq/L (ref 135–145)
Total Protein: 7.4 g/dL (ref 6.0–8.3)

## 2013-12-20 MED ORDER — CALCIPOTRIENE 0.005 % EX CREA: TOPICAL_CREAM | Freq: Two times a day (BID) | CUTANEOUS | Status: AC

## 2013-12-20 MED ORDER — BETAMETHASONE DIPROPIONATE AUG 0.05 % EX OINT: TOPICAL_OINTMENT | Freq: Two times a day (BID) | CUTANEOUS | Status: AC

## 2013-12-20 MED ORDER — BUDESONIDE 32 MCG/ACT NA SUSP: 2.0000 | Freq: Every day | NASAL | Status: AC

## 2013-12-20 NOTE — Assessment & Plan Note (Signed)
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 I'll start him on Dovonex but he may not be able to afford that so alternatively he'll is dip Earleen ointment.

## 2013-12-20 NOTE — Patient Instructions (Signed)
For the rash I would recommend the DOVONEX cream---if it is too expensive you can substitute the diprolene ointment. Use the rhinocort (or generic equivalent) daily--2 sprays each side of nose once or twice a day --for at lest a month. Lets see if this helops the dizziness I will send you a note about your labs I will also have our nutritionist call you GREAT to see you

## 2013-12-20 NOTE — Assessment & Plan Note (Signed)
Blood pressure looks good today. He continues to try the following healthier diet but is not having much success with weight loss. He like to see the nutritionist and will try to set that up to

## 2013-12-20 NOTE — Progress Notes (Signed)
Patient ID: Brett Shaw, male   DOB: 1961-01-05, 53 y.o.   MRN: [id number]  Brett Shaw - 53 y.o. male MRN [id number]  Date of birth: 1960-08-17    SUBJECTIVE:     Dizziness. Last 2 weeks she's had several episodes of dizziness, diffusely occurs while he is up and moving around. He was seen at urgent care or the weekend because he had a particularly bad episode that started while he was working. He had no other associated symptoms with it. Seems like it occurs most when he is up, moving around, he thinks it may be related to some head movements. #2. Also had a rash on his years. ROS:     No chest pain, no shortness of breath, no orthopnea. No lower strandy edema.  PERTINENT  PMH / PSH FH / / SH:  Past Medical, Surgical, Social, and Family History Reviewed & Updated per EMR.  Pertinent Historical Findings include:  Negative exercise treadmill test done within the last 2 years.  OBJECTIVE: BP 119/79  Pulse 82  Temp(Src) 98.2 F (36.8 C) (Oral)  Ht 5\' 11"  (1.803 m)  Wt 295 lb 4.8 oz (133.947 kg)  BMI 41.20 kg/m2  Physical Exam:  Vital signs are reviewed. CV: Regular rate and rhythm HEENT: TMs bilaterally are retracted to have good landmarks. Her face is clear. Nasal mucosa is boggy and swollen. LUNGS: Clear to auscultation SKIN: Thickened hypertrophic skin on the right elbow with distinctive plaque formation. Similar areas on bilateral ear lobes.  ASSESSMENT & PLAN: See problem based charting & AVS for pt instructions. Additionally: Dizziness which I think is related to eustachian tube dysfunction. He's been unable to tolerate Flonase in the past so we'll try him on Rhinocort. Followup 2 months. Also go ahead and get his blood work that we would typically due to his physical which is scheduled at the end of the summer. This will double check his hemoglobin, kidney function, blood sugar

## 2013-12-28 ENCOUNTER — Encounter: Payer: Self-pay | Admitting: Family Medicine

## 2014-02-01 ENCOUNTER — Ambulatory Visit (INDEPENDENT_AMBULATORY_CARE_PROVIDER_SITE_OTHER): Payer: BC Managed Care – PPO | Admitting: Family Medicine

## 2014-02-01 DIAGNOSIS — E785 Hyperlipidemia, unspecified: Secondary | ICD-10-CM

## 2014-02-01 NOTE — Progress Notes (Signed)
Patient ID: Brett Shaw, male   DOB: Jun 15, 1961, 53 y.o.   MRN: [id number]  Nutrition Clinic Visit  Pt seen in nutrition clinic with Dr. Jenne Campus. Pt presents to discuss nutrition in light of his obesity and hyperlipidemia.  He is the primary caregiver for his mother, who will soon be moving to an assisted living facility. He identifies that he stress eats much of the time. 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 He has an anaphylactic shellfish allergy.   Eating pattern: 3 meals a day, 5 or 6 snacks  Foods/beverages frequently in diet:  -Water, powerade, gatorade, milk, coffee (2 cups per day on weekdays) -Croissant, honey buns from vending machines -Burger, wrap, baked potato chips -Avoids sodas -Apple & banana per day -Breakfast bars -Piece of meat, veggie, starch (no bread) -Gingerale - does this if stomach is bothered, 16oz bottle (32 oz total per day) -12 oz bottles miller lite - drinks 12pack weekly -Bag of popcorn every other night (microwave, generic food lion brand)  Physical activity: no regular exercise  24-hr recall: (Up at 4:30 AM)  B (5:00 AM)-   [place] bacon, 2 scrambled eggs, 2 pieces of wheat toast with canola oil butter, jelly, water, pineapple juice 16 oz Snk (8:00 AM)- Cup of coffee, banana Snk (10:00 AM)  powerade, breakfast bars (food lion generic brand) L (12:00 PM)-   Quarter pounder with cheese/lettuce/tomato, large fry, large regular coke (32 oz) - [employer]'s Snk (2:00 PM)-  M&M's 12oz, powerade (16oz) Snk (5:30 PM)-  16oz water, ate apple D (7:00 PM)-   2 cups zataran's yellow rice, 3oz hamburger patty (no bun), low sodium 8oz v8 This was a typical day for him.  Recommendations: 1. 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 Make extra dinner and take half of it for lunch the next day to avoid buying lunch at work. 4. Find containers to bring your  lunch. 5. Use frozen vegetables instead of canned vegetables (stock up on these head of time). 6. Eat twice the volume of vegetables as either proteins or starch at each meal. 7. Focus on drinking less beer/powerade/gingerale. Drink lots of water instead.  Follow up in nutrition clinic with Dr. Jenne Campus and Dr. Awanda Mink on August 13.  Chrisandra Netters, MD Family Medicine PGY-3   40 minutes of direct face-to-face time was spent with the patient, at least 50% of this was spent counseling.

## 2014-02-01 NOTE — Patient Instructions (Signed)
The goals we discussed today: 1. Exercise 30 minutes 3-4 times per week 2. 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 Make extra dinner and take half of it for lunch the next day to avoid buying lunch at work. 4. Find containers to bring your lunch with you. 5. Use frozen vegetables instead of canned vegetables (stock up on these head of time). 6. Eat twice the volume of vegetables as either proteins or starch at each meal. 7. Focus on drinking less beer/powerade/gingerale. Drink lots of water instead.  The triad of healthy eating: cheap, nutritious, quick - usually only two are possible.  Schedule at the front desk for your next appointment on August 13th in the morning.

## 2014-02-22 ENCOUNTER — Ambulatory Visit: Payer: BC Managed Care – PPO | Admitting: Family Medicine

## 2014-03-15 ENCOUNTER — Ambulatory Visit: Payer: BC Managed Care – PPO | Admitting: Family Medicine

## 2014-07-11 ENCOUNTER — Ambulatory Visit (INDEPENDENT_AMBULATORY_CARE_PROVIDER_SITE_OTHER): Payer: BC Managed Care – PPO | Admitting: Family Medicine

## 2014-07-11 ENCOUNTER — Encounter: Payer: Self-pay | Admitting: Family Medicine

## 2014-07-11 VITALS — BP 122/75 | HR 97 | Temp 98.1°F | Ht 71.0 in | Wt 305.1 lb

## 2014-07-11 DIAGNOSIS — L918 Other hypertrophic disorders of the skin: Secondary | ICD-10-CM

## 2014-07-11 DIAGNOSIS — K219 Gastro-esophageal reflux disease without esophagitis: Secondary | ICD-10-CM | POA: Diagnosis not present

## 2014-07-11 DIAGNOSIS — J189 Pneumonia, unspecified organism: Secondary | ICD-10-CM | POA: Diagnosis not present

## 2014-07-11 NOTE — Assessment & Plan Note (Signed)
Some recent increase in symptoms but does not want to add or increase his medication. He continues to take that regularly. We'll follow.

## 2014-07-11 NOTE — Progress Notes (Signed)
   Subjective:    Patient ID: Brett Shaw, male    DOB: 1960-10-24, 53 y.o.   MRN: [id number]  HPI Follow-up recent diagnosis of many acquired pneumonia. Had fairly acute onset of shortness of breath and chest tightness. Was seen at the emergency department where chest x-ray showed right infrahilar opacity. He was prescribed antibiotics, azithromycin, which she has completed. They gave him a nebulizer treatment in #2 department which seemed to help and they sent him home with a albuterol inhaler which she has used 2-3 times a day since. He's feeling much better.  Has a new skin tag on his nose that he would like to have removed at some point. It is quite irritating.  Hasan increase in his GERD symptoms. Continues takes medicine regularly.   Review of Systems Shortness of breath is resolved. Chest tightness is resolved. Has had no more episodes of fever. Still has some cough but nonproductive.    Objective:   Physical Exam Vital signs reviewed. GENERAL: Well-developed, well-nourished, no acute distress. CARDIOVASCULAR: Regular rate and rhythm no murmur gallop or rub LUNGS: Clear to auscultation bilaterally, no rales or wheeze. ABDOMEN: Soft positive bowel sounds NEURO: No gross focal neurological deficits. MSK: Movement of extremity x 4.         Assessment & Plan:  Many acquired pneumonia versus bronchitis sounds like it was successfully treated. We discussed tapering off the albuterol inhaler. I would like to get posttreatment chest x-ray in about 4 weeks. I'll see him back at that time.

## 2014-07-11 NOTE — Assessment & Plan Note (Signed)
We'll see him back at his convenience for removal.

## 2014-07-16 ENCOUNTER — Telehealth: Payer: Self-pay | Admitting: Family Medicine

## 2014-07-16 MED ORDER — ALBUTEROL SULFATE HFA 108 (90 BASE) MCG/ACT IN AERS: 2.0000 | INHALATION_SPRAY | Freq: Four times a day (QID) | RESPIRATORY_TRACT | Status: AC | PRN

## 2014-07-16 NOTE — Telephone Encounter (Signed)
Needs refill on albuterol inhaler [facility] [address]

## 2014-07-16 NOTE — Telephone Encounter (Signed)
Albuterol inhaler is not on current medication list.  Derl Barrow, RN

## 2014-08-08 ENCOUNTER — Ambulatory Visit (INDEPENDENT_AMBULATORY_CARE_PROVIDER_SITE_OTHER): Payer: BLUE CROSS/BLUE SHIELD | Admitting: Family Medicine

## 2014-08-08 ENCOUNTER — Encounter: Payer: Self-pay | Admitting: Family Medicine

## 2014-08-08 VITALS — BP 134/81 | HR 95 | Temp 97.9°F | Ht 71.0 in | Wt 308.7 lb

## 2014-08-08 DIAGNOSIS — D492 Neoplasm of unspecified behavior of bone, soft tissue, and skin: Secondary | ICD-10-CM | POA: Diagnosis not present

## 2014-08-08 DIAGNOSIS — J158 Pneumonia due to other specified bacteria: Secondary | ICD-10-CM | POA: Diagnosis not present

## 2014-08-08 DIAGNOSIS — I10 Essential (primary) hypertension: Secondary | ICD-10-CM

## 2014-08-08 DIAGNOSIS — J156 Pneumonia due to other aerobic Gram-negative bacteria: Secondary | ICD-10-CM

## 2014-08-08 DIAGNOSIS — J1569 Pneumonia due to other gram-negative bacteria: Secondary | ICD-10-CM | POA: Insufficient documentation

## 2014-08-08 DIAGNOSIS — K449 Diaphragmatic hernia without obstruction or gangrene: Secondary | ICD-10-CM | POA: Insufficient documentation

## 2014-08-08 NOTE — Patient Instructions (Signed)
Please get your chest x ray at your convenience I will see you in a few for a CPE

## 2014-08-09 ENCOUNTER — Encounter: Payer: Self-pay | Admitting: Family Medicine

## 2014-08-09 NOTE — Assessment & Plan Note (Signed)
Seems to have totally resolved. He think we should get a follow-up chest x-ray and arrange for that today. He can get discontinue the albuterol MDI whenever he wants to. I do not think  he'll need it long-term but if he decides it made a significant difference I would be happy to prescribe for him long-term.

## 2014-08-09 NOTE — Assessment & Plan Note (Signed)
Good control today. Not on antihypertensive meds at this time. . We did briefly discuss adding back some more regular exercise particularly in light of the fact that he's lost several of his closest friends in his age group recently. Ideally he should also lose some weight.

## 2014-08-09 NOTE — Progress Notes (Signed)
   Subjective:    Patient ID: Brett Shaw, male    DOB: 05-09-1961, 54 y.o.   MRN: [id number]  HPI #1. This is his second follow-up for any card pneumonia. He is using the albuterol inhaler once every couple days now. Still has an occasional dry cough but nonproductive. Still feels a little washed out but think that may also be related to the fact that he has not done a lot of activity over the holidays and has may be more than he should have. He thinks he is pretty much back to baseline from a respiratory status. #2. Has had several friends in his age group dino last 1-2 years and this is making him think he needs to take a little bit better care of himself #3. Here today for removal of apparent skin tag on the underside of the tip of his nose. It's been continuing to grow and is quite irritated when he blows his nose. It has not been bleeding but he says he feels like "the wicked witch" with this thing hanging off his nose.   Review of Systems See history of present illness. Additionally no fever, sweats, chills, unusual weight change. No lower extremity edema. No wheezing.    Objective:   Physical Exam Vital signs are reviewed CV: Regular rate and rhythm without murmur LUNGS: Clear to auscultation bilaterally with good  air movement in all lung fields. No rales or wheeze. Skin: On the underside of the septum of his nose is a 4 mm skin tag that appears to be mildly irritated although there is no current bleeding. It is pedunculated. Surrounding area including the nares look normal.  Procedure NOTE: Patient given informed consent, signed copy scanned of the chart. The area of the parents skin tag was cleansed with some alcohol and then the base was crushed using curved hemostats. I then snipped it off using scissors. This was done in a aseptic environment with sterile instruments. There was some mild bleeding which we treated with some Drysol. Negligible as in less than 1 mL blood  loss.       Assessment & Plan:  #1. Parents skin tag. He has grown a lot since I saw last has just a few are characteristics I think we'll send this off to make sure it's nothing else. Post procedure instructions were given. No restrictions. This should heal up nicely as it's a very small area.

## 2014-08-14 ENCOUNTER — Ambulatory Visit
Admission: RE | Admit: 2014-08-14 | Discharge: 2014-08-14 | Disposition: A | Discharge status: Home / Self Care | Payer: BLUE CROSS/BLUE SHIELD | Source: Ambulatory Visit | Attending: Family Medicine | Admitting: Family Medicine

## 2014-08-14 ENCOUNTER — Encounter: Payer: Self-pay | Admitting: Family Medicine

## 2014-08-14 DIAGNOSIS — J156 Pneumonia due to other aerobic Gram-negative bacteria: Secondary | ICD-10-CM

## 2014-10-05 ENCOUNTER — Other Ambulatory Visit: Payer: Self-pay | Admitting: *Deleted

## 2014-10-05 MED ORDER — OMEPRAZOLE 40 MG PO CPDR: DELAYED_RELEASE_CAPSULE | ORAL | Status: AC

## 2014-11-01 ENCOUNTER — Telehealth: Payer: Self-pay | Admitting: Family Medicine

## 2014-11-01 NOTE — Telephone Encounter (Signed)
Mr. Moffatt need a statement from provider that he's being treated for a conditon that require him to only be able to work 9 hrs per day and no more than 45 hours per week until June 1st.

## 2014-11-02 NOTE — Telephone Encounter (Signed)
Patient coming in 11/07/2014 to see Dr. Nori Riis, will follow up on this then at that visit

## 2014-11-02 NOTE — Telephone Encounter (Signed)
Dear Dema Severin Team I do not know anything about this---he will have to make an appointment to see me so we can discuss. THANKS! Dorcas Mcmurray

## 2014-11-07 ENCOUNTER — Ambulatory Visit (INDEPENDENT_AMBULATORY_CARE_PROVIDER_SITE_OTHER): Payer: BLUE CROSS/BLUE SHIELD | Admitting: Family Medicine

## 2014-11-07 VITALS — BP 132/70 | HR 94 | Temp 98.0°F | Ht 71.0 in | Wt 307.3 lb

## 2014-11-07 DIAGNOSIS — Z9189 Other specified personal risk factors, not elsewhere classified: Secondary | ICD-10-CM | POA: Diagnosis not present

## 2014-11-07 DIAGNOSIS — E785 Hyperlipidemia, unspecified: Secondary | ICD-10-CM | POA: Diagnosis not present

## 2014-11-07 DIAGNOSIS — I10 Essential (primary) hypertension: Secondary | ICD-10-CM | POA: Diagnosis not present

## 2014-11-07 DIAGNOSIS — Z0000 Encounter for general adult medical examination without abnormal findings: Secondary | ICD-10-CM | POA: Diagnosis not present

## 2014-11-07 NOTE — Patient Instructions (Signed)
I am giving you a note about how to set up a colonoscopy as you are due (at age 54). I will send you a note about your blood work. Great to see you!

## 2014-11-08 ENCOUNTER — Encounter: Payer: Self-pay | Admitting: Family Medicine

## 2014-11-08 LAB — COMPREHENSIVE METABOLIC PANEL
ALT: 30 U/L (ref 0–53)
AST: 22 U/L (ref 0–37)
Albumin: 4.1 g/dL (ref 3.5–5.2)
Alkaline Phosphatase: 87 U/L (ref 39–117)
BUN: 13 mg/dL (ref 6–23)
CALCIUM: 8.9 mg/dL (ref 8.4–10.5)
CO2: 23 mEq/L (ref 19–32)
CREATININE: 0.82 mg/dL (ref 0.50–1.35)
Chloride: 105 mEq/L (ref 96–112)
Glucose, Bld: 84 mg/dL (ref 70–99)
Potassium: 4.3 mEq/L (ref 3.5–5.3)
Sodium: 137 mEq/L (ref 135–145)
Total Bilirubin: 0.5 mg/dL (ref 0.2–1.2)
Total Protein: 7.3 g/dL (ref 6.0–8.3)

## 2014-11-08 LAB — PSA: PSA: 1.62 ng/mL (ref <=4.00)

## 2014-11-08 NOTE — Assessment & Plan Note (Signed)
He said I had given him a letter in the past that curtailed his work week. After looking back to restart extensively of family found where we had decreased his work until he a complete his cardiac workup. He had a normal exercise treadmill test in 2014. I discussed with him that currently there is nothing that would medically disqualify him from working overtime.

## 2014-11-08 NOTE — Assessment & Plan Note (Signed)
His blood pressures fairly well controlled. His weight is still elevated we discussed this. He has had his colonoscopy. He is up-to-date on his tetanus shot. We will check PSA today. We discussed exercise which currently is difficult for him as he is working 70 hours a week.

## 2014-11-08 NOTE — Assessment & Plan Note (Signed)
He still borders on having elevated blood pressure. He does not really want to take medications. I suspect and as her blood pressure monitor will be helpful for him but he doesn't want to pursue that at this time. He's pain on making some fairly significant lifestyle changes, specifically he's thinking about quitting his current job in May. He thinks that'll decrease his stress and also his blood pressure. He may be moving back to [place] area to be with his daughter and granddaughter.

## 2014-11-08 NOTE — Progress Notes (Signed)
   Subjective:    Patient ID: Brett Shaw, male    DOB: Jan 06, 1961, 54 y.o.   MRN: [id number]  HPI Here for checkup, wellness visit and discussion about work restrictions. #1. His job is requiring him to work 6 or 7 days a week 12-15 hours a day. He complains of having a lot of fatigue and increased palpitations when he works this shift. Wonders if he get a medical excuse for restriction of his work week to 45 hours. #2. Wellness visit. Doesn't really have any issues that he'd like to discuss.   Review of Systems Review of Systems  Constitutional: Negative for: fever, activity change, appetite change, fatigue, unexpected weight change.  HEENT: Negative for: ear pain, sore throat, trouble swallowing.  No neck rigidity or stiffness.  Eyes: Negative for eye pain and no visual disturbances.  Respiratory: Negative for cough, no difficulty breathing.  No SOB. Cardiovascular: Negative for: leg swelling. No chest pain is having occasional heart palpitations, especially when he is fatigued. Gastrointestinal: Negative for: nausea, abdominal pain, diarrhea, constipation. Noted no blood in stool.  Endocrine: No increased urination and no increased thirst. No cold or heat intolerance that is a change from baseline. Genitourinary: No urinary frequency, no  urinary pain. No genital sores or lesions. Musculoskeletal: Negative for joint swelling and arthralgias.  Neurological: Negative for dizziness, syncope, weakness and headaches.  Psychiatric/Behavioral: Negative for: confusion, sleep disturbance, dysphoric mood, decreased concentration and agitation. .        Objective:   Physical Exam Vital signs reviewed GENERALl: Well developed, well nourished, in no acute distress. Obese NECK: Supple, FROM, without lymphadenopathy.  THYROID: normal without nodularity CAROTID ARTERIES: without bruits LUNGS: clear to auscultation bilaterally. No wheezes or rales. HEART: Regular rate and rhythm, no  murmurs ABDOMEN: soft with positive bowel sounds. No rebound or guarding. Exam is limited by habitus. MSK: MOE x 4 SKIN some peeling skin bilateral palms consistent with his known dermatitis. It actually looks better today than has on some previous visits. NEURO: no focal deficits PSYCHIATRIC: Alert and oriented 4. Interactive. Asks and answers questions appropriately. No psychomotor retardation, no agitation. Speech is normal in content and fluency.        Assessment & Plan:

## 2014-11-28 ENCOUNTER — Encounter: Payer: Self-pay | Admitting: Family Medicine

## 2015-10-29 IMAGING — CR DG CHEST 2V
2 series · 2 of 2 positions shown · non-contrast
Comparison: Chest x-ray of 11/15/2011

CLINICAL DATA: Pneumonia, followup

EXAM:
CHEST  2 VIEW

[view not recorded (1 of 2)]
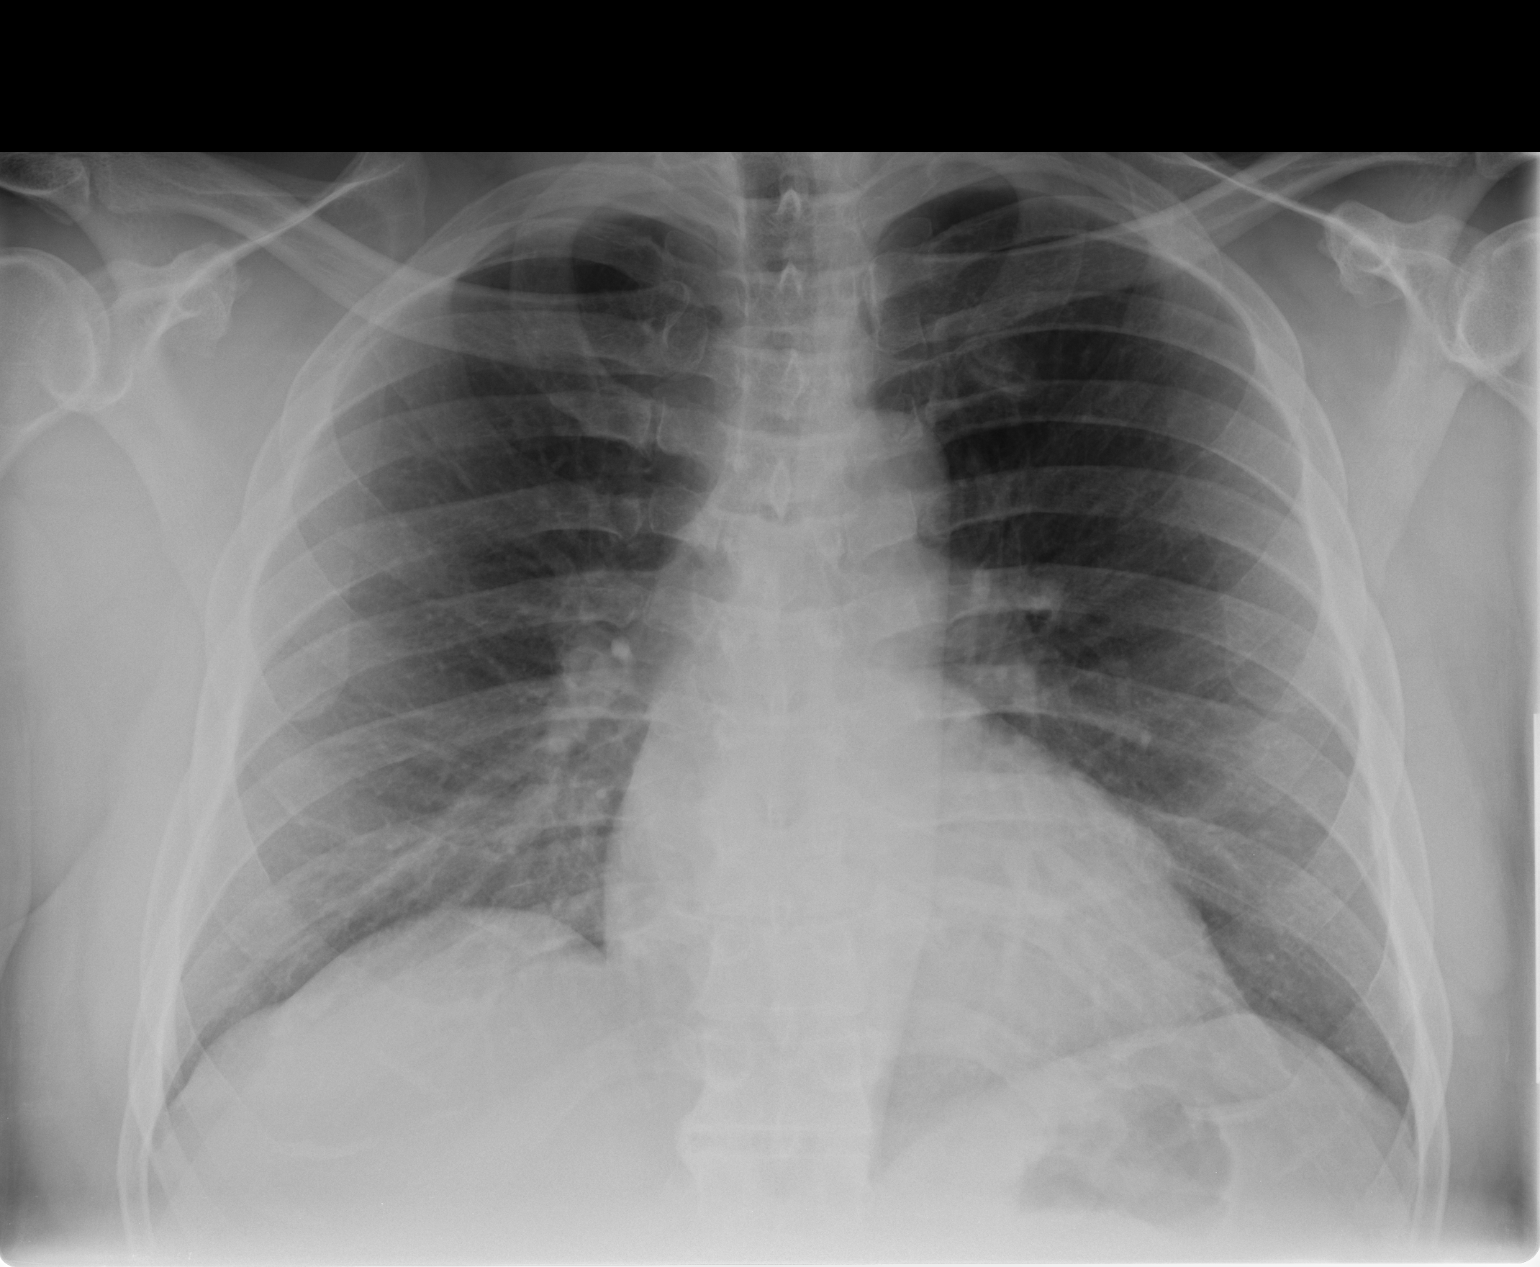

[view not recorded (2 of 2)]
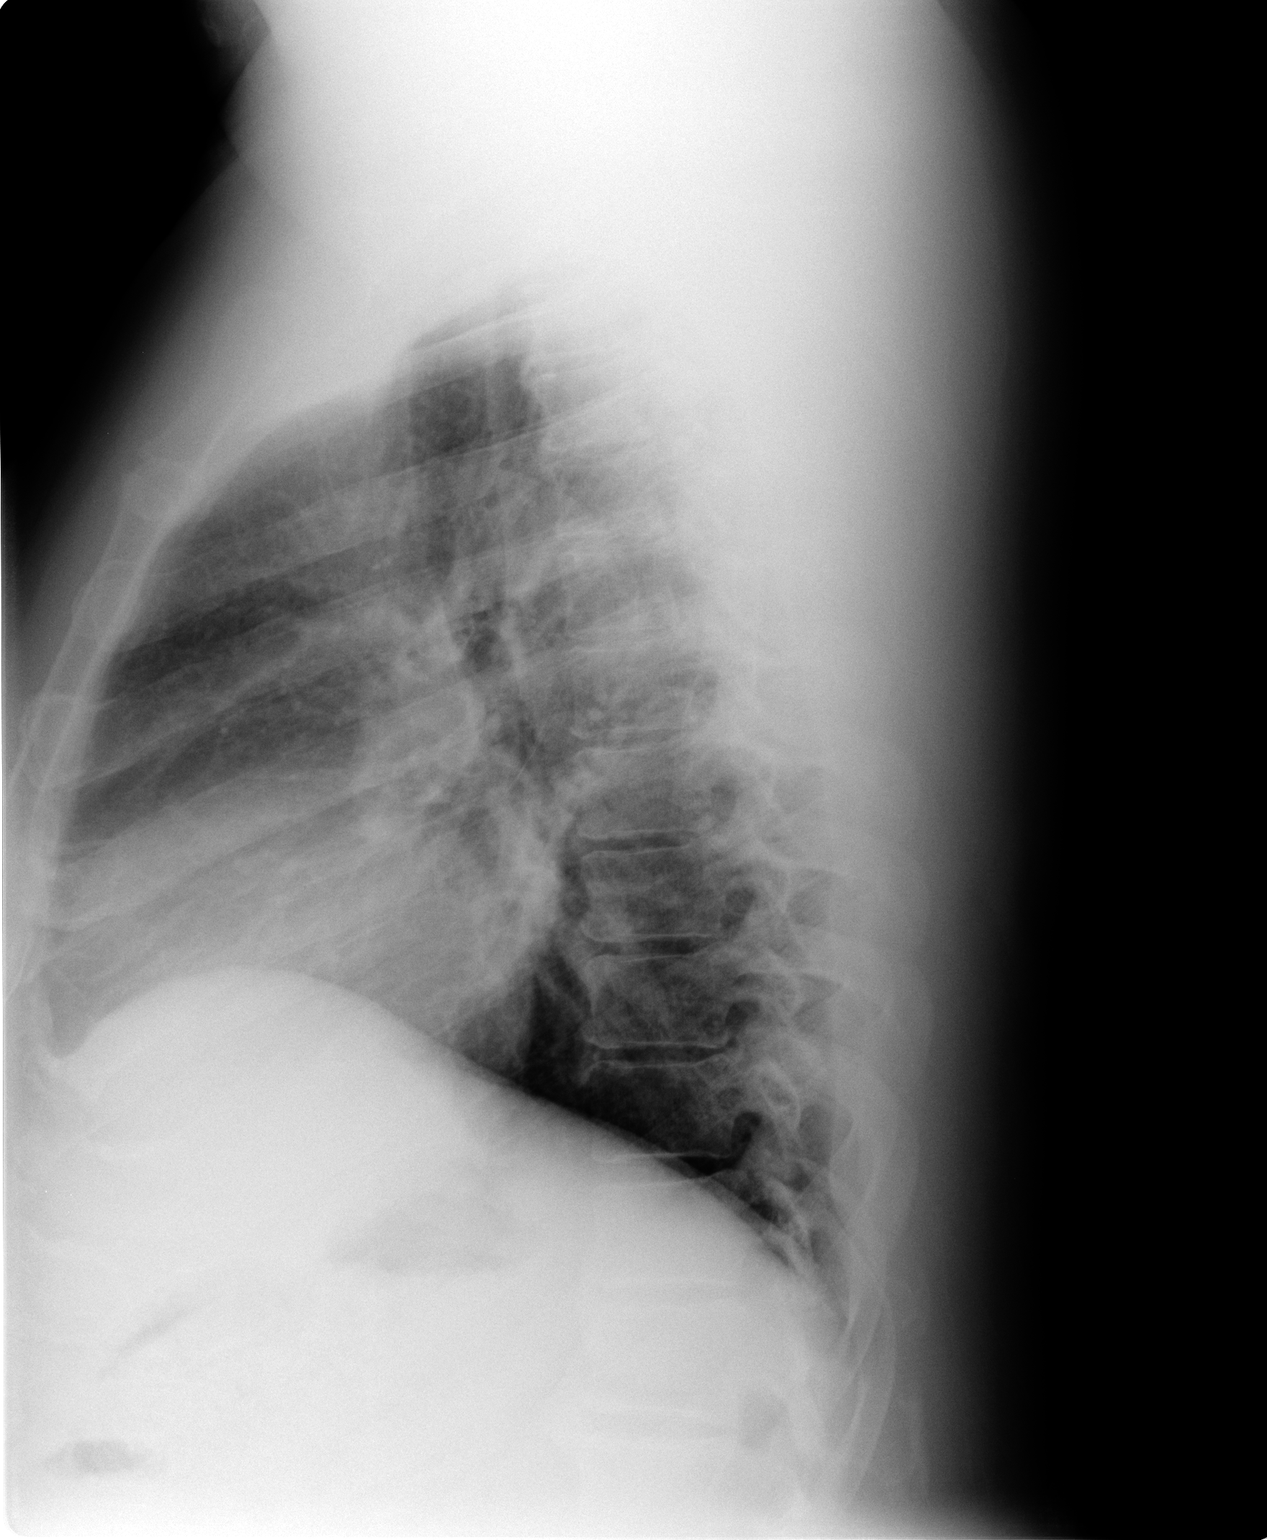

[2 of 2 positions shown; findings below may reference images not displayed]

FINDINGS: No infiltrate or effusion is seen. Mediastinal and hilar contours
are unremarkable with mild peribronchial thickening present. The
heart is borderline enlarged and stable. No acute bony abnormality
is seen.
IMPRESSION: No pneumonia or effusion. Mild peribronchial thickening, most likely
chronic.

## 2022-05-22 ENCOUNTER — Encounter: Payer: Self-pay | Admitting: Internal Medicine
